# Patient Record
Sex: Male | Born: 1950
Health system: Southern US, Community
[De-identification: ages and names within clinical notes are randomized; demographics above are authoritative.]

## PROBLEM LIST (undated history)

## (undated) DIAGNOSIS — E119 Type 2 diabetes mellitus without complications: Secondary | ICD-10-CM

## (undated) DIAGNOSIS — E785 Hyperlipidemia, unspecified: Secondary | ICD-10-CM

## (undated) DIAGNOSIS — I10 Essential (primary) hypertension: Secondary | ICD-10-CM

## (undated) DIAGNOSIS — G629 Polyneuropathy, unspecified: Secondary | ICD-10-CM

## (undated) HISTORY — PX: HERNIA REPAIR: SHX51

## (undated) HISTORY — PX: CHOLECYSTECTOMY: SHX55

## (undated) HISTORY — PX: KNEE SURGERY: SHX244

---

## 2015-07-17 DIAGNOSIS — E1165 Type 2 diabetes mellitus with hyperglycemia: Secondary | ICD-10-CM | POA: Diagnosis not present

## 2015-07-17 DIAGNOSIS — R1011 Right upper quadrant pain: Secondary | ICD-10-CM | POA: Diagnosis not present

## 2015-07-17 DIAGNOSIS — L299 Pruritus, unspecified: Secondary | ICD-10-CM | POA: Diagnosis not present

## 2015-07-17 DIAGNOSIS — N183 Chronic kidney disease, stage 3 (moderate): Secondary | ICD-10-CM | POA: Diagnosis not present

## 2015-07-24 DIAGNOSIS — Z1389 Encounter for screening for other disorder: Secondary | ICD-10-CM | POA: Diagnosis not present

## 2015-07-24 DIAGNOSIS — E1165 Type 2 diabetes mellitus with hyperglycemia: Secondary | ICD-10-CM | POA: Diagnosis not present

## 2015-07-24 DIAGNOSIS — Z9181 History of falling: Secondary | ICD-10-CM | POA: Diagnosis not present

## 2015-12-15 DIAGNOSIS — E1140 Type 2 diabetes mellitus with diabetic neuropathy, unspecified: Secondary | ICD-10-CM | POA: Diagnosis not present

## 2015-12-15 DIAGNOSIS — L97819 Non-pressure chronic ulcer of other part of right lower leg with unspecified severity: Secondary | ICD-10-CM | POA: Diagnosis not present

## 2015-12-15 DIAGNOSIS — E1165 Type 2 diabetes mellitus with hyperglycemia: Secondary | ICD-10-CM | POA: Diagnosis not present

## 2015-12-15 DIAGNOSIS — L03031 Cellulitis of right toe: Secondary | ICD-10-CM | POA: Diagnosis not present

## 2015-12-22 DIAGNOSIS — E11610 Type 2 diabetes mellitus with diabetic neuropathic arthropathy: Secondary | ICD-10-CM | POA: Diagnosis not present

## 2015-12-22 DIAGNOSIS — M542 Cervicalgia: Secondary | ICD-10-CM | POA: Diagnosis not present

## 2015-12-23 DIAGNOSIS — E860 Dehydration: Secondary | ICD-10-CM | POA: Diagnosis not present

## 2015-12-23 DIAGNOSIS — E1165 Type 2 diabetes mellitus with hyperglycemia: Secondary | ICD-10-CM | POA: Diagnosis not present

## 2015-12-23 DIAGNOSIS — Z794 Long term (current) use of insulin: Secondary | ICD-10-CM | POA: Diagnosis not present

## 2016-02-29 DIAGNOSIS — R1084 Generalized abdominal pain: Secondary | ICD-10-CM | POA: Diagnosis not present

## 2016-04-04 DIAGNOSIS — E1140 Type 2 diabetes mellitus with diabetic neuropathy, unspecified: Secondary | ICD-10-CM | POA: Diagnosis not present

## 2016-04-04 DIAGNOSIS — E1165 Type 2 diabetes mellitus with hyperglycemia: Secondary | ICD-10-CM | POA: Diagnosis not present

## 2016-04-04 DIAGNOSIS — E1129 Type 2 diabetes mellitus with other diabetic kidney complication: Secondary | ICD-10-CM | POA: Diagnosis not present

## 2016-04-04 DIAGNOSIS — N183 Chronic kidney disease, stage 3 (moderate): Secondary | ICD-10-CM | POA: Diagnosis not present

## 2016-04-11 DIAGNOSIS — Z86010 Personal history of colonic polyps: Secondary | ICD-10-CM | POA: Diagnosis not present

## 2016-04-11 DIAGNOSIS — K580 Irritable bowel syndrome with diarrhea: Secondary | ICD-10-CM | POA: Diagnosis not present

## 2016-04-11 DIAGNOSIS — R1011 Right upper quadrant pain: Secondary | ICD-10-CM | POA: Diagnosis not present

## 2016-04-11 DIAGNOSIS — K219 Gastro-esophageal reflux disease without esophagitis: Secondary | ICD-10-CM | POA: Diagnosis not present

## 2016-04-26 DIAGNOSIS — K2900 Acute gastritis without bleeding: Secondary | ICD-10-CM | POA: Diagnosis not present

## 2016-04-26 DIAGNOSIS — D122 Benign neoplasm of ascending colon: Secondary | ICD-10-CM | POA: Diagnosis not present

## 2016-04-26 DIAGNOSIS — R1012 Left upper quadrant pain: Secondary | ICD-10-CM | POA: Diagnosis not present

## 2016-04-26 DIAGNOSIS — R1011 Right upper quadrant pain: Secondary | ICD-10-CM | POA: Diagnosis not present

## 2016-04-26 DIAGNOSIS — Z1211 Encounter for screening for malignant neoplasm of colon: Secondary | ICD-10-CM | POA: Diagnosis not present

## 2016-04-26 DIAGNOSIS — Z86010 Personal history of colonic polyps: Secondary | ICD-10-CM | POA: Diagnosis not present

## 2016-04-26 DIAGNOSIS — K219 Gastro-esophageal reflux disease without esophagitis: Secondary | ICD-10-CM | POA: Diagnosis not present

## 2016-04-26 DIAGNOSIS — K2950 Unspecified chronic gastritis without bleeding: Secondary | ICD-10-CM | POA: Diagnosis not present

## 2016-04-26 DIAGNOSIS — K5730 Diverticulosis of large intestine without perforation or abscess without bleeding: Secondary | ICD-10-CM | POA: Diagnosis not present

## 2016-07-01 DIAGNOSIS — N183 Chronic kidney disease, stage 3 (moderate): Secondary | ICD-10-CM | POA: Diagnosis not present

## 2016-07-01 DIAGNOSIS — E1165 Type 2 diabetes mellitus with hyperglycemia: Secondary | ICD-10-CM | POA: Diagnosis not present

## 2016-07-01 DIAGNOSIS — E785 Hyperlipidemia, unspecified: Secondary | ICD-10-CM | POA: Diagnosis not present

## 2016-07-01 DIAGNOSIS — I10 Essential (primary) hypertension: Secondary | ICD-10-CM | POA: Diagnosis not present

## 2016-07-01 DIAGNOSIS — E1140 Type 2 diabetes mellitus with diabetic neuropathy, unspecified: Secondary | ICD-10-CM | POA: Diagnosis not present

## 2016-07-03 DIAGNOSIS — E1165 Type 2 diabetes mellitus with hyperglycemia: Secondary | ICD-10-CM | POA: Diagnosis not present

## 2016-07-17 DIAGNOSIS — E1165 Type 2 diabetes mellitus with hyperglycemia: Secondary | ICD-10-CM | POA: Diagnosis not present

## 2016-10-02 DIAGNOSIS — D539 Nutritional anemia, unspecified: Secondary | ICD-10-CM | POA: Diagnosis not present

## 2016-10-02 DIAGNOSIS — Z125 Encounter for screening for malignant neoplasm of prostate: Secondary | ICD-10-CM | POA: Diagnosis not present

## 2016-10-02 DIAGNOSIS — E1165 Type 2 diabetes mellitus with hyperglycemia: Secondary | ICD-10-CM | POA: Diagnosis not present

## 2016-10-02 DIAGNOSIS — Z79899 Other long term (current) drug therapy: Secondary | ICD-10-CM | POA: Diagnosis not present

## 2016-10-15 DIAGNOSIS — E1165 Type 2 diabetes mellitus with hyperglycemia: Secondary | ICD-10-CM | POA: Diagnosis not present

## 2017-04-22 DIAGNOSIS — E1165 Type 2 diabetes mellitus with hyperglycemia: Secondary | ICD-10-CM | POA: Diagnosis not present

## 2017-05-05 DIAGNOSIS — L820 Inflamed seborrheic keratosis: Secondary | ICD-10-CM | POA: Diagnosis not present

## 2017-05-05 DIAGNOSIS — D485 Neoplasm of uncertain behavior of skin: Secondary | ICD-10-CM | POA: Diagnosis not present

## 2017-05-05 DIAGNOSIS — D225 Melanocytic nevi of trunk: Secondary | ICD-10-CM | POA: Diagnosis not present

## 2017-05-19 DIAGNOSIS — J40 Bronchitis, not specified as acute or chronic: Secondary | ICD-10-CM | POA: Diagnosis not present

## 2017-06-13 DIAGNOSIS — J40 Bronchitis, not specified as acute or chronic: Secondary | ICD-10-CM | POA: Diagnosis not present

## 2017-07-14 DIAGNOSIS — J209 Acute bronchitis, unspecified: Secondary | ICD-10-CM | POA: Diagnosis not present

## 2017-07-21 DIAGNOSIS — E1140 Type 2 diabetes mellitus with diabetic neuropathy, unspecified: Secondary | ICD-10-CM | POA: Diagnosis not present

## 2017-07-21 DIAGNOSIS — L03031 Cellulitis of right toe: Secondary | ICD-10-CM | POA: Diagnosis not present

## 2017-07-28 DIAGNOSIS — Z1331 Encounter for screening for depression: Secondary | ICD-10-CM | POA: Diagnosis not present

## 2017-07-28 DIAGNOSIS — Z1339 Encounter for screening examination for other mental health and behavioral disorders: Secondary | ICD-10-CM | POA: Diagnosis not present

## 2017-07-28 DIAGNOSIS — L03031 Cellulitis of right toe: Secondary | ICD-10-CM | POA: Diagnosis not present

## 2017-07-28 DIAGNOSIS — E11621 Type 2 diabetes mellitus with foot ulcer: Secondary | ICD-10-CM | POA: Diagnosis not present

## 2017-07-28 DIAGNOSIS — L97519 Non-pressure chronic ulcer of other part of right foot with unspecified severity: Secondary | ICD-10-CM | POA: Diagnosis not present

## 2017-07-29 DIAGNOSIS — Z794 Long term (current) use of insulin: Secondary | ICD-10-CM | POA: Diagnosis not present

## 2017-07-29 DIAGNOSIS — L97512 Non-pressure chronic ulcer of other part of right foot with fat layer exposed: Secondary | ICD-10-CM | POA: Diagnosis not present

## 2017-07-29 DIAGNOSIS — E11621 Type 2 diabetes mellitus with foot ulcer: Secondary | ICD-10-CM | POA: Diagnosis not present

## 2017-07-29 DIAGNOSIS — I10 Essential (primary) hypertension: Secondary | ICD-10-CM | POA: Diagnosis not present

## 2017-07-29 DIAGNOSIS — E1140 Type 2 diabetes mellitus with diabetic neuropathy, unspecified: Secondary | ICD-10-CM | POA: Diagnosis not present

## 2017-08-05 DIAGNOSIS — L97519 Non-pressure chronic ulcer of other part of right foot with unspecified severity: Secondary | ICD-10-CM | POA: Diagnosis not present

## 2017-08-05 DIAGNOSIS — E11621 Type 2 diabetes mellitus with foot ulcer: Secondary | ICD-10-CM | POA: Diagnosis not present

## 2017-08-05 DIAGNOSIS — I10 Essential (primary) hypertension: Secondary | ICD-10-CM | POA: Diagnosis not present

## 2017-08-05 DIAGNOSIS — L97511 Non-pressure chronic ulcer of other part of right foot limited to breakdown of skin: Secondary | ICD-10-CM | POA: Diagnosis not present

## 2017-08-19 DIAGNOSIS — I10 Essential (primary) hypertension: Secondary | ICD-10-CM | POA: Diagnosis not present

## 2017-08-19 DIAGNOSIS — E1140 Type 2 diabetes mellitus with diabetic neuropathy, unspecified: Secondary | ICD-10-CM | POA: Diagnosis not present

## 2017-08-19 DIAGNOSIS — E1129 Type 2 diabetes mellitus with other diabetic kidney complication: Secondary | ICD-10-CM | POA: Diagnosis not present

## 2017-08-19 DIAGNOSIS — N183 Chronic kidney disease, stage 3 (moderate): Secondary | ICD-10-CM | POA: Diagnosis not present

## 2017-09-12 ENCOUNTER — Observation Stay (HOSPITAL_COMMUNITY)
Admission: EM | Admit: 2017-09-12 | Discharge: 2017-09-13 | Disposition: A | Discharge status: Home / Self Care | Payer: Medicare Other | Attending: Internal Medicine | Admitting: Internal Medicine

## 2017-09-12 ENCOUNTER — Encounter (HOSPITAL_COMMUNITY): Payer: Self-pay

## 2017-09-12 ENCOUNTER — Emergency Department (HOSPITAL_COMMUNITY): Payer: Medicare Other

## 2017-09-12 DIAGNOSIS — E785 Hyperlipidemia, unspecified: Secondary | ICD-10-CM | POA: Diagnosis present

## 2017-09-12 DIAGNOSIS — R079 Chest pain, unspecified: Secondary | ICD-10-CM | POA: Diagnosis not present

## 2017-09-12 DIAGNOSIS — G629 Polyneuropathy, unspecified: Secondary | ICD-10-CM

## 2017-09-12 DIAGNOSIS — N183 Chronic kidney disease, stage 3 unspecified: Secondary | ICD-10-CM | POA: Diagnosis present

## 2017-09-12 DIAGNOSIS — I10 Essential (primary) hypertension: Secondary | ICD-10-CM | POA: Diagnosis not present

## 2017-09-12 DIAGNOSIS — R0789 Other chest pain: Principal | ICD-10-CM | POA: Insufficient documentation

## 2017-09-12 DIAGNOSIS — Z7982 Long term (current) use of aspirin: Secondary | ICD-10-CM | POA: Insufficient documentation

## 2017-09-12 DIAGNOSIS — R05 Cough: Secondary | ICD-10-CM | POA: Diagnosis not present

## 2017-09-12 DIAGNOSIS — E119 Type 2 diabetes mellitus without complications: Secondary | ICD-10-CM | POA: Diagnosis not present

## 2017-09-12 DIAGNOSIS — E1122 Type 2 diabetes mellitus with diabetic chronic kidney disease: Secondary | ICD-10-CM | POA: Diagnosis not present

## 2017-09-12 DIAGNOSIS — K219 Gastro-esophageal reflux disease without esophagitis: Secondary | ICD-10-CM | POA: Insufficient documentation

## 2017-09-12 DIAGNOSIS — I129 Hypertensive chronic kidney disease with stage 1 through stage 4 chronic kidney disease, or unspecified chronic kidney disease: Secondary | ICD-10-CM | POA: Insufficient documentation

## 2017-09-12 DIAGNOSIS — Z794 Long term (current) use of insulin: Secondary | ICD-10-CM | POA: Insufficient documentation

## 2017-09-12 DIAGNOSIS — R0602 Shortness of breath: Secondary | ICD-10-CM

## 2017-09-12 DIAGNOSIS — E1142 Type 2 diabetes mellitus with diabetic polyneuropathy: Secondary | ICD-10-CM | POA: Diagnosis not present

## 2017-09-12 DIAGNOSIS — R072 Precordial pain: Secondary | ICD-10-CM | POA: Diagnosis not present

## 2017-09-12 DIAGNOSIS — R9431 Abnormal electrocardiogram [ECG] [EKG]: Secondary | ICD-10-CM | POA: Diagnosis not present

## 2017-09-12 DIAGNOSIS — I4510 Unspecified right bundle-branch block: Secondary | ICD-10-CM | POA: Diagnosis not present

## 2017-09-12 DIAGNOSIS — Z79899 Other long term (current) drug therapy: Secondary | ICD-10-CM | POA: Insufficient documentation

## 2017-09-12 DIAGNOSIS — I160 Hypertensive urgency: Secondary | ICD-10-CM | POA: Diagnosis not present

## 2017-09-12 HISTORY — DX: Type 2 diabetes mellitus without complications: E11.9

## 2017-09-12 HISTORY — DX: Essential (primary) hypertension: I10

## 2017-09-12 HISTORY — DX: Hyperlipidemia, unspecified: E78.5

## 2017-09-12 HISTORY — DX: Polyneuropathy, unspecified: G62.9

## 2017-09-12 LAB — BASIC METABOLIC PANEL
Anion gap: 9 (ref 5–15)
BUN: 14 mg/dL (ref 6–20)
CALCIUM: 10.1 mg/dL (ref 8.9–10.3)
CO2: 25 mmol/L (ref 22–32)
Chloride: 104 mmol/L (ref 101–111)
Creatinine, Ser: 1.45 mg/dL — ABNORMAL HIGH (ref 0.61–1.24)
GFR calc Af Amer: 56 mL/min — ABNORMAL LOW (ref >60)
GFR, EST NON AFRICAN AMERICAN: 48 mL/min — AB (ref >60)
GLUCOSE: 155 mg/dL — AB (ref 65–99)
Potassium: 4.2 mmol/L (ref 3.5–5.1)
Sodium: 138 mmol/L (ref 135–145)

## 2017-09-12 LAB — TROPONIN I: Troponin I: <0.03 ng/mL (ref <0.03)

## 2017-09-12 LAB — URINALYSIS, ROUTINE W REFLEX MICROSCOPIC
BILIRUBIN URINE: NEGATIVE
Bacteria, UA: NONE SEEN
GLUCOSE, UA: NEGATIVE mg/dL
KETONES UR: NEGATIVE mg/dL
LEUKOCYTES UA: NEGATIVE
NITRITE: NEGATIVE
PH: 5 (ref 5.0–8.0)
Protein, ur: 30 mg/dL — AB
SPECIFIC GRAVITY, URINE: 1.006 (ref 1.005–1.030)
SQUAMOUS EPITHELIAL / LPF: NONE SEEN

## 2017-09-12 LAB — I-STAT TROPONIN, ED: TROPONIN I, POC: 0.03 ng/mL (ref 0.00–0.08)

## 2017-09-12 LAB — CBG MONITORING, ED
GLUCOSE-CAPILLARY: 208 mg/dL — AB (ref 65–99)
Glucose-Capillary: 112 mg/dL — ABNORMAL HIGH (ref 65–99)
Glucose-Capillary: 134 mg/dL — ABNORMAL HIGH (ref 65–99)

## 2017-09-12 LAB — HEMOGLOBIN A1C
HEMOGLOBIN A1C: 7.8 % — AB (ref 4.8–5.6)
MEAN PLASMA GLUCOSE: 177.16 mg/dL

## 2017-09-12 LAB — CBC
HCT: 38.5 % — ABNORMAL LOW (ref 39.0–52.0)
HEMOGLOBIN: 12.8 g/dL — AB (ref 13.0–17.0)
MCH: 27.5 pg (ref 26.0–34.0)
MCHC: 33.2 g/dL (ref 30.0–36.0)
MCV: 82.6 fL (ref 78.0–100.0)
Platelets: 172 10*3/uL (ref 150–400)
RBC: 4.66 MIL/uL (ref 4.22–5.81)
RDW: 14.2 % (ref 11.5–15.5)
WBC: 7.4 10*3/uL (ref 4.0–10.5)

## 2017-09-12 LAB — D-DIMER, QUANTITATIVE: D-Dimer, Quant: 0.42 ug/mL-FEU (ref 0.00–0.50)

## 2017-09-12 LAB — CK: CK TOTAL: 222 U/L (ref 49–397)

## 2017-09-12 LAB — LIPID PANEL
Cholesterol: 113 mg/dL (ref 0–200)
HDL: 34 mg/dL — ABNORMAL LOW (ref >40)
LDL CALC: 62 mg/dL (ref 0–99)
TRIGLYCERIDES: 85 mg/dL (ref <150)
Total CHOL/HDL Ratio: 3.3 RATIO
VLDL: 17 mg/dL (ref 0–40)

## 2017-09-12 LAB — BRAIN NATRIURETIC PEPTIDE: B Natriuretic Peptide: 165.8 pg/mL — ABNORMAL HIGH (ref 0.0–100.0)

## 2017-09-12 MED ORDER — HYDRALAZINE HCL 20 MG/ML IJ SOLN: 10.0000 mg | INTRAMUSCULAR | Status: DC | PRN

## 2017-09-12 MED ORDER — INSULIN GLARGINE 300 UNIT/ML ~~LOC~~ SOPN: 80.0000 [IU] | PEN_INJECTOR | Freq: Every day | SUBCUTANEOUS | Status: DC

## 2017-09-12 MED ORDER — INSULIN ASPART 100 UNIT/ML ~~LOC~~ SOLN
0.0000 [IU] | Freq: Three times a day (TID) | SUBCUTANEOUS | Status: DC
  Administered 2017-09-12: 1 [IU] via SUBCUTANEOUS
  Administered 2017-09-13 (×2): 3 [IU] via SUBCUTANEOUS
  Administered 2017-09-13: 2 [IU] via SUBCUTANEOUS
  Filled 2017-09-12 (×3): qty 1

## 2017-09-12 MED ORDER — METOPROLOL SUCCINATE ER 100 MG PO TB24: 100.0000 mg | ORAL_TABLET | Freq: Every day | ORAL | Status: DC

## 2017-09-12 MED ORDER — GI COCKTAIL ~~LOC~~
30.0000 mL | Freq: Once | ORAL | Status: AC
  Administered 2017-09-12: 30 mL via ORAL
  Filled 2017-09-12: qty 30

## 2017-09-12 MED ORDER — AMLODIPINE BESYLATE 5 MG PO TABS
5.0000 mg | ORAL_TABLET | Freq: Once | ORAL | Status: AC
  Administered 2017-09-12: 5 mg via ORAL
  Filled 2017-09-12: qty 1

## 2017-09-12 MED ORDER — NITROGLYCERIN 0.4 MG SL SUBL: 0.4000 mg | SUBLINGUAL_TABLET | SUBLINGUAL | Status: DC | PRN

## 2017-09-12 MED ORDER — LIDOCAINE 5 % EX PTCH
1.0000 | MEDICATED_PATCH | CUTANEOUS | Status: DC
  Administered 2017-09-12: 1 via TRANSDERMAL
  Filled 2017-09-12: qty 1

## 2017-09-12 MED ORDER — PRAVASTATIN SODIUM 20 MG PO TABS
10.0000 mg | ORAL_TABLET | Freq: Every day | ORAL | Status: DC
  Filled 2017-09-12: qty 1

## 2017-09-12 MED ORDER — ONDANSETRON HCL 4 MG/2ML IJ SOLN: 4.0000 mg | Freq: Four times a day (QID) | INTRAMUSCULAR | Status: DC | PRN

## 2017-09-12 MED ORDER — ACETAMINOPHEN 325 MG PO TABS
650.0000 mg | ORAL_TABLET | ORAL | Status: DC | PRN
  Administered 2017-09-12: 650 mg via ORAL
  Filled 2017-09-12: qty 2

## 2017-09-12 MED ORDER — METOPROLOL SUCCINATE 100 MG PO CS24: 100.0000 mg | EXTENDED_RELEASE_CAPSULE | Freq: Every day | ORAL | Status: DC

## 2017-09-12 MED ORDER — PREGABALIN 25 MG PO CAPS: 75.0000 mg | ORAL_CAPSULE | Freq: Every day | ORAL | Status: DC

## 2017-09-12 MED ORDER — LISINOPRIL 20 MG PO TABS
20.0000 mg | ORAL_TABLET | Freq: Two times a day (BID) | ORAL | Status: DC
  Administered 2017-09-12 – 2017-09-13 (×2): 20 mg via ORAL
  Filled 2017-09-12 (×2): qty 1

## 2017-09-12 MED ORDER — INSULIN ASPART 100 UNIT/ML ~~LOC~~ SOLN
0.0000 [IU] | Freq: Every day | SUBCUTANEOUS | Status: DC
  Administered 2017-09-12: 2 [IU] via SUBCUTANEOUS
  Filled 2017-09-12: qty 1

## 2017-09-12 MED ORDER — ENOXAPARIN SODIUM 40 MG/0.4ML ~~LOC~~ SOLN
40.0000 mg | SUBCUTANEOUS | Status: DC
  Administered 2017-09-12: 40 mg via SUBCUTANEOUS
  Filled 2017-09-12 (×2): qty 0.4

## 2017-09-12 MED ORDER — HYDRALAZINE HCL 20 MG/ML IJ SOLN
10.0000 mg | Freq: Once | INTRAMUSCULAR | Status: AC
  Administered 2017-09-12: 10 mg via INTRAVENOUS
  Filled 2017-09-12: qty 1

## 2017-09-12 MED ORDER — PREGABALIN 75 MG PO CAPS
75.0000 mg | ORAL_CAPSULE | Freq: Every day | ORAL | Status: DC
  Administered 2017-09-12: 75 mg via ORAL
  Filled 2017-09-12: qty 1

## 2017-09-12 MED ORDER — FLUTICASONE PROPIONATE 50 MCG/ACT NA SUSP: 1.0000 | Freq: Every day | NASAL | Status: DC | PRN

## 2017-09-12 MED ORDER — FUROSEMIDE 10 MG/ML IJ SOLN
20.0000 mg | Freq: Once | INTRAMUSCULAR | Status: AC
  Administered 2017-09-12: 20 mg via INTRAVENOUS
  Filled 2017-09-12: qty 2

## 2017-09-12 NOTE — Consult Note (Signed)
Cardiology Consultation:   Patient ID: Grant Patterson; [id number]; [birth year]   Admit date: 09/12/2017 Date of Consult: 09/12/2017  Primary Care Provider: Mateo Flow, MD Primary Cardiologist: new to [facility]   Patient Profile:   Grant Patterson is a 67 y.o. male with a hx of HTN, DM, and HLD who is being seen today for the evaluation of chest pain and SOB at the request of Dr. Reesa Chew.  History of Present Illness:   Grant Patterson is a 67 y.o. male with a hx of HTN, DM, and HLD who presents with CP, SOB, and cough for the past several weeks. Patient states he was in his usual state of health until 2 months ago when he developed multiple URIs (back to back) associated with SOB and a non-productive cough for which he was treated with antibiotics 2 weeks ago. His symptoms did not improve. At his last follow-up visit with his PMD office, he was found to have an elevated BP and his metoprolol was increased from 50mg  to 100mg  daily. Over the past several days, he has noted elevated BP at home (as high as 200s/100s) prompting him to present to an urgent care in [place], where he was found to have an abnormal EKG. Patient also reports left upper chest pain, non-radiating, described as a dull pain for the past 3 days. Pain is not associated with activity, SOB, diaphoresis, N/V. Patient states he thought it could be indigestion and has been taking tums without relief. Has had some LE edema, worsening over the past month. He denies orthopnea, PND, DOE, chest pain on exertion. Denies fevers.  Past Medical History:  Diagnosis Date  . Diabetes mellitus without complication ([place])   . Hyperlipidemia   . Hypertension   . Neuropathy     Past Surgical History:  Procedure Laterality Date  . CHOLECYSTECTOMY    . HERNIA REPAIR    . KNEE SURGERY         Inpatient Medications: Scheduled Meds: . enoxaparin (LOVENOX) injection  40 mg Subcutaneous Q24H  . gi cocktail  30 mL Oral Once  . insulin aspart  0-5  Units Subcutaneous QHS  . insulin aspart  0-9 Units Subcutaneous TID WC  . lidocaine  1 patch Transdermal Q24H  . lisinopril  20 mg Oral BID  . [START ON 09/13/2017] metoprolol succinate  100 mg Oral Daily  . [START ON 09/13/2017] pravastatin  10 mg Oral q1800  . [START ON 09/13/2017] pregabalin  75 mg Oral QHS   Continuous Infusions:  PRN Meds: acetaminophen, fluticasone, hydrALAZINE, nitroGLYCERIN, ondansetron (ZOFRAN) IV  Allergies:   No Known Allergies  Social History:   Social History   Socioeconomic History  . Marital status: Married    Spouse name: Not on file  . Number of children: Not on file  . Years of education: Not on file  . Highest education level: Not on file  Social Needs  . Financial resource strain: Not on file  . Food insecurity - worry: Not on file  . Food insecurity - inability: Not on file  . Transportation needs - medical: Not on file  . Transportation needs - non-medical: Not on file  Occupational History  . Not on file  Tobacco Use  . Smoking status: Never Smoker  . Smokeless tobacco: Never Used  Substance and Sexual Activity  . Alcohol use: No    Frequency: Never  . Drug use: No  . Sexual activity: Not Currently  Other Topics Concern  .  Not on file  Social History Narrative  . Not on file    Family History:   Father with lung cancer, Mother with COPD. No family history of MI.  ROS:  Please see the history of present illness.   All other ROS reviewed and negative.     Physical Exam/Data:   Vitals:   09/12/17 1430 09/12/17 1445 09/12/17 1515 09/12/17 1600  BP: (!) 176/86 (!) 183/86 (!) 197/89 (!) 184/91  Pulse: 68 71 70 71  Resp: 20 16 16  (!) 21  Temp:      TempSrc:      SpO2: 96% 96% 96% 95%  Weight:      Height:       No intake or output data in the 24 hours ending 09/12/17 1736 Filed Weights   09/12/17 1318  Weight: 215 lb (97.5 kg)   Body mass index is 32.69 kg/m.  General:  Well nourished, well developed, laying in bed  in no acute distress HEENT: sclera anicteric  Lymph: no adenopathy Neck: no JVD Vascular: No carotid bruits; distal pulses 2+ bilaterally Cardiac:  normal S1, S2; RRR; no murmur, gallops, or rubs Lungs:  clear to auscultation bilaterally, no wheezing, rhonchi or rales  Abd: NABS, soft, nontender, no hepatomegaly Ext: 1+ b/l LE edema Musculoskeletal:  No deformities, BUE and BLE strength normal and equal Skin: warm and dry  Neuro:  CNs 2-12 intact, no focal abnormalities noted Psych:  Normal affect   EKG:  The EKG was personally reviewed and demonstrates:  EKG with NSR, RBBB, isolated STE V2 Telemetry:  Telemetry was personally reviewed and demonstrates:  SR with occasional sinus tach  Relevant CV Studies: Echo ordered  Laboratory Data:  Chemistry Recent Labs  Lab 09/12/17 1354  NA 138  K 4.2  CL 104  CO2 25  GLUCOSE 155*  BUN 14  CREATININE 1.45*  CALCIUM 10.1  GFRNONAA 48*  GFRAA 56*  ANIONGAP 9    No results for input(s): PROT, ALBUMIN, AST, ALT, ALKPHOS, BILITOT in the last 168 hours. Hematology Recent Labs  Lab 09/12/17 1354  WBC 7.4  RBC 4.66  HGB 12.8*  HCT 38.5*  MCV 82.6  MCH 27.5  MCHC 33.2  RDW 14.2  PLT 172   Cardiac EnzymesNo results for input(s): TROPONINI in the last 168 hours.  Recent Labs  Lab 09/12/17 1337  TROPIPOC 0.03    BNP Recent Labs  Lab 09/12/17 1344  BNP 165.8*    DDimer  Recent Labs  Lab 09/12/17 1507  DDIMER 0.42    Radiology/Studies:  Dg Chest 2 View  Result Date: 09/12/2017 CLINICAL DATA:  Chest pain short of breath EXAM: CHEST  2 VIEW COMPARISON:  12/23/2015 FINDINGS: The heart size and mediastinal contours are within normal limits. Both lungs are clear. The visualized skeletal structures are unremarkable. IMPRESSION: No active cardiopulmonary disease. Electronically Signed   By: Franchot Gallo M.D.   On: 09/12/2017 14:05    Assessment and Plan:   1. Chest pain: chest pain is atypical - Troponin negative x1  - trend x3 - EKG with NSR, RBBB, isolated STE V2 - BNP 165.8 - Ddimer wnl - CXR without acute findings - s/p 1x dose IV lasix 20mg   - Echo ordered - Would favor exercise stress test given risk factors for CAD - will make NPO after MN. Holding home metoprolol in AM for stress test  2. HTN: BP elevated since presentation to the ED - Home HCTZ on hold - pt  received IV lasix x1 dose - Continue home metoprolol, lisinopril - Can consider adding amlodipine if BP remains poorly controlled  3. Elevated Cr: states Cr 1.4 on outpatient labs 2-3 weeks ago - Cr 1.45 - Continue to monitor closely  4. DM: - HgbA1C ordered - Continue ISS per primary team  5. HLD: - Lipid profile ordered - Continue statin   For questions or updates, please contact [facility] Please consult [email or web address] for contact info under Cardiology/STEMI.   Signed, Abigail Butts, PA-C  09/12/2017 5:36 PM [phone]  Patient examined chart reviewed. Exam with white male in no distress Clear lungs no murmur. No abdominal bruit good peripheral pulses and no edema. Appears to have poorly controlled HTN in setting of DM and elevated Cr Has had URI with cough for few weeks. CXR with no pneumonia Atypical chest pain in this setting with no acute ECG changes only RBBB and negative troponin Given DM would order exercise myovue for am. Continue ACE for BP in setting DM and can add norvasc 5-10 mg if needed for better control Discussed care with patient, PA, wife and daughter.   Jenkins Rouge

## 2017-09-12 NOTE — ED Triage Notes (Signed)
Patient presents from urgent care in [place] via [employer]. He states that he has been having left upper chest pain that is worse with movement, palpation, and deep breathing. Pt received 324mg  asa and 1 sl nitro pta at the urgent care. With ekg ems reported some st elevation only in v2. Pt is also showing right bbb on the monitor. Pt states that the pain has been there for the past two weeks. Alert and oriented. Protocols initiated upon arrival to dept.

## 2017-09-12 NOTE — ED Notes (Signed)
Heart healthy carb modified dinner tray ordered @1746 

## 2017-09-12 NOTE — ED Provider Notes (Signed)
[facility] EMERGENCY DEPARTMENT Provider Note   CSN: [id number] Arrival date & time: 09/12/17  1313     History   Chief Complaint Chief Complaint  Patient presents with  . Chest Pain    HPI Grant Patterson is a 67 y.o. male.  Patient with history of diabetes, hypertension on 3 medications, high cholesterol, remote catheterization --presents with complaint of shortness of breath, cough, and chest pain over the past several weeks.  Patient states that he went to see urgent care in [place] today.  He was found to have an abnormal EKG with ST segment elevation isolated to V2.  He was sent to the emergency department for further evaluation.  Patient has had shortness of breath and cough for the past 2 weeks and has been treated with ABX without improvement.  Cough is nonproductive.  He has worsening of lower extremity swelling.  Shortness of breath has "slowed me down a little bit".  No orthopnea.  He has chest pain in the upper left chest that does not radiate.  This is described as a soreness that only occurs when he coughs.  His blood pressure has been elevated recently and his primary care physician has been adjusting some of his medications.  No fevers, URI symptoms or other flulike symptoms.  Patient denies risk factors for pulmonary embolism including: unilateral leg swelling, history of DVT/PE/other blood clots, use of exogenous hormones, recent immobilizations, recent surgery, recent travel (>4hr segment), malignancy, hemoptysis.  The onset of this condition was acute.        Past Medical History:  Diagnosis Date  . Diabetes mellitus without complication ([place])   . Hyperlipidemia   . Hypertension   . Neuropathy     There are no active problems to display for this patient.   Past Surgical History:  Procedure Laterality Date  . CHOLECYSTECTOMY    . HERNIA REPAIR    . KNEE SURGERY         Home Medications    Prior to Admission medications     Medication Sig Start Date End Date Taking? Authorizing Provider  acetaminophen (TYLENOL) 325 MG tablet Take 650 mg by mouth every 6 (six) hours as needed for mild pain.    Yes [provider]  fluticasone (FLONASE) 50 MCG/ACT nasal spray Place 1 spray into both nostrils daily as needed for allergies or rhinitis.   Yes [provider]  hydrochlorothiazide (HYDRODIURIL) 25 MG tablet Take 12.5 mg by mouth daily.    Yes [provider]  insulin lispro (HUMALOG) 100 UNIT/ML injection Inject 6 Units into the skin 3 (three) times daily before meals.    Yes [provider]  JANUVIA 50 MG tablet Take 50 mg by mouth daily. 08/20/17  Yes [provider]  lisinopril (PRINIVIL,ZESTRIL) 20 MG tablet Take 20 mg by mouth 2 (two) times daily.    Yes [provider]  lovastatin (MEVACOR) 10 MG tablet Take 10 mg by mouth.    Yes [provider]  metFORMIN (GLUCOPHAGE-XR) 750 MG 24 hr tablet Take 750 mg by mouth 2 (two) times daily.   Yes [provider]  Metoprolol Succinate 100 MG CS24 Take 100 mg by mouth daily.    Yes [provider]  pregabalin (LYRICA) 75 MG capsule Take 75 mg by mouth.    Yes [provider]  TOUJEO SOLOSTAR 300 UNIT/ML SOPN Inject 80 Units into the skin daily. 09/06/17  Yes [provider]  Family History History reviewed. No pertinent family history.  Social History Social History   Tobacco Use  . Smoking status: Never Smoker  . Smokeless tobacco: Never Used  Substance Use Topics  . Alcohol use: No    Frequency: Never  . Drug use: No     Allergies   Patient has no known allergies.   Review of Systems Review of Systems  Constitutional: Negative for diaphoresis and fever.  HENT: Negative for congestion.   Eyes: Negative for redness.  Respiratory: Positive for cough and shortness of breath. Negative for wheezing.   Cardiovascular: Positive for chest pain and leg swelling.  Negative for palpitations.  Gastrointestinal: Negative for abdominal pain, nausea and vomiting.  Genitourinary: Negative for dysuria.  Musculoskeletal: Negative for back pain and neck pain.  Skin: Negative for rash.  Neurological: Negative for syncope and light-headedness.  Psychiatric/Behavioral: The patient is not nervous/anxious.      Physical Exam Updated Vital Signs BP (!) 190/94 (BP Location: Left Arm)   Pulse 73   Temp 97.8 F (36.6 C) (Oral)   Resp 20   Ht 5\' 8"  (1.727 m)   Wt 97.5 kg (215 lb)   SpO2 93%   BMI 32.69 kg/m   Physical Exam  Constitutional: He appears well-developed and well-nourished.  HENT:  Head: Normocephalic and atraumatic.  Mouth/Throat: Mucous membranes are normal. Mucous membranes are not dry.  Eyes: Conjunctivae are normal.  Neck: Trachea normal and normal range of motion. Neck supple. Normal carotid pulses and no JVD present. No muscular tenderness present. Carotid bruit is not present. No tracheal deviation present.  Cardiovascular: Normal rate, regular rhythm, S1 normal, S2 normal, normal heart sounds and intact distal pulses. Exam reveals no distant heart sounds and no decreased pulses.  No murmur heard. Pulmonary/Chest: Effort normal. No respiratory distress. He has no decreased breath sounds. He has wheezes (mild, scattered). He has no rhonchi. He has no rales. He exhibits no tenderness.  CP on palpation of the upper left chest wall.   Abdominal: Soft. Normal aorta and bowel sounds are normal. There is no tenderness. There is no rebound and no guarding.  Musculoskeletal: He exhibits no edema.  Trace symmetric LE edema to ankles. Socks leave slight markings.   Neurological: He is alert.  Skin: Skin is warm and dry. He is not diaphoretic. No cyanosis. No pallor.  Psychiatric: He has a normal mood and affect.  Nursing note and vitals reviewed.    ED Treatments / Results  Labs (all labs ordered are listed, but only abnormal results are  displayed) Labs Reviewed  BASIC METABOLIC PANEL - Abnormal; Notable for the following components:      Result Value   Glucose, Bld 155 (*)    Creatinine, Ser 1.45 (*)    GFR calc non Af Amer 48 (*)    GFR calc Af Amer 56 (*)    All other components within normal limits  CBC - Abnormal; Notable for the following components:   Hemoglobin 12.8 (*)    HCT 38.5 (*)    All other components within normal limits  BRAIN NATRIURETIC PEPTIDE - Abnormal; Notable for the following components:   B Natriuretic Peptide 165.8 (*)    All other components within normal limits  D-DIMER, QUANTITATIVE (NOT AT [facility])  I-STAT TROPONIN, ED    EKG  EKG Interpretation  Date/Time:  Friday September 12 2017 13:19:08 EST Ventricular Rate:  74 PR Interval:    QRS Duration: 150 QT Interval:  424  QTC Calculation: 471 R Axis:   -60 Text Interpretation:  Sinus rhythm RBBB and LAFB Left ventricular hypertrophy ST elevation in V2, possible related to LAFB and LVH Confirmed by Duffy Bruce [id number]) on 09/12/2017 1:26:30 PM       Radiology Dg Chest 2 View  Result Date: 09/12/2017 CLINICAL DATA:  Chest pain short of breath EXAM: CHEST  2 VIEW COMPARISON:  12/23/2015 FINDINGS: The heart size and mediastinal contours are within normal limits. Both lungs are clear. The visualized skeletal structures are unremarkable. IMPRESSION: No active cardiopulmonary disease. Electronically Signed   By: Franchot Gallo M.D.   On: 09/12/2017 14:05    Procedures Procedures (including critical care time)  Medications Ordered in ED Medications  furosemide (LASIX) injection 20 mg (not administered)     Initial Impression / Assessment and Plan / ED Course  I have reviewed the triage vital signs and the nursing notes.  Pertinent labs & imaging results that were available during my care of the patient were reviewed by me and considered in my medical decision making (see chart for details).     Patient seen and examined. Work-up  initiated. EKG reviewed. V2 isolated abnormal lead.   Vital signs reviewed and are as follows: BP (!) 176/86   Pulse 68   Temp 97.8 F (36.6 C) (Oral)   Resp 20   Ht 5\' 8"  (1.727 m)   Wt 97.5 kg (215 lb)   SpO2 96%   BMI 32.69 kg/m   Pt d/w Dr. Ellender Hose who will see. D-dimer added.   4:00 PM Will admit for CP/SOB, possible component of new-onset CHF. Will need echo.   HEART score = 5. 20mg  lasix ordered.   D-dimer is normal.   Will request unassigned admit.   4:36 PM Hospitalist to see. Repeat EKG unchanged.   Final Clinical Impressions(s) / ED Diagnoses   Final diagnoses:  Precordial pain  Shortness of breath  Abnormal EKG   Admit.   ED Discharge Orders    None       Carlisle Cater, Hershal Coria 09/12/17 1636    Duffy Bruce, MD 09/13/17 [phone]

## 2017-09-12 NOTE — H&P (Signed)
History and Physical    Grant Patterson KWI:[id number] DOB: Nov 29, 1950 DOA: 09/12/2017   PCP: Mateo Flow, MD   Attending physician: Reesa Chew  Patient coming from/Resides with: Private residence/family  Chief Complaint: Chest pain  HPI: Grant Patterson is a 67 y.o. male with medical history significant for diabetes mellitus on combination insulin and oral agents (diabetes greater than 25 years), difficult to control hypertension, peripheral neuropathy, chronic kidney disease stage III and dyslipidemia.  Patient reports that about 1-2 weeks ago he began having soreness in his left chest that was primarily reproducible with palpation directly over chest wall and upper extremity movement only.  He was not having typical exertional chest pain.  Over the past 3-4 days he has been experiencing reflux and has taken Tums with minimal relief.  In the past 48 hours he has taken 1 BC powder and a total of 6-8 Aleve.  He has not had any dark or bloody stools.  For about 2 to months he has had unexplained coughing and has seen multiple providers without any improvement in symptoms.  Over the past 2 weeks he has had increasing pedal edema especially at night.  He has never smoked.  Today because of persistent left-sided chest discomfort he presented initially to an urgent care in [place].  EMS performed EKG and noted the patient have ST segment elevation isolated to lead V2 therefore he was sent to the [facility] ER for further evaluation and treatment.  Upon evaluation here patient was found to have underlying right bundle branch block and previously noted ST segment elevation in V2 more consistent with J-point elevation in early repolarization.  Initial troponin unremarkable.  D-dimer negative at 0.42, BNP slightly elevated at 166 in the context of chronic kidney disease with a GFR 48.  Chest x-ray negative for edema or infiltrate and patient is not hypoxemic.  Patient will be placed in observation status to evaluate  ongoing chest pain.  ED Course:  Vital Signs: BP (!) 184/91   Pulse 71   Temp 97.8 F (36.6 C) (Oral)   Resp (!) 21   Ht '5\' 8"'$  (1.727 m)   Wt 97.5 kg (215 lb)   SpO2 95%   BMI 32.69 kg/m  Chest x-ray: As above Lab data: Sodium 138, potassium 4.2, chloride 104, CO2 25, glucose 155, BUN 14, creatinine 1.45, calcium 10.1, anion gap 9, BNP 166, poc troponin 0 0.03, white count 7400 differential not obtained, hemoglobin 12.8, platelets 172,000, d-dimer 0.42 Medications and treatments: Lasix 20 mg IV x1  Review of Systems:  In addition to the HPI above,  No Fever-chills, myalgias or other constitutional symptoms No Headache, changes with Vision or hearing, new weakness, tingling, numbness in any extremity, dizziness, dysarthria or word finding difficulty, gait disturbance or imbalance, tremors or seizure activity No problems swallowing food or Liquids, indigestion/reflux, choking or coughing while eating, abdominal pain with or after eating No palpitations, orthopnea or DOE No Abdominal pain, N/V, melena,hematochezia, dark tarry stools, constipation No dysuria, malodorous urine, hematuria or flank pain No new skin rashes, lesions, masses or bruises, No new joint pains, aches, swelling or redness No recent unintentional weight gain or loss No polyuria, polydypsia or polyphagia   Past Medical History:  Diagnosis Date  . Diabetes mellitus without complication ([place])   . Hyperlipidemia   . Hypertension   . Neuropathy     Past Surgical History:  Procedure Laterality Date  . CHOLECYSTECTOMY    . HERNIA REPAIR    .  KNEE SURGERY      Social History   Socioeconomic History  . Marital status: Married    Spouse name: Not on file  . Number of children: Not on file  . Years of education: Not on file  . Highest education level: Not on file  Social Needs  . Financial resource strain: Not on file  . Food insecurity - worry: Not on file  . Food insecurity - inability: Not on file  .  Transportation needs - medical: Not on file  . Transportation needs - non-medical: Not on file  Occupational History  . Not on file  Tobacco Use  . Smoking status: Never Smoker  . Smokeless tobacco: Never Used  Substance and Sexual Activity  . Alcohol use: No    Frequency: Never  . Drug use: No  . Sexual activity: Not Currently  Other Topics Concern  . Not on file  Social History Narrative  . Not on file    Mobility: Independent Work history: Not obtained   No Known Allergies  Family history reviewed and not pertinent -patient reports father died of lung cancer and mother died of complications related to COPD and had "stress related" heart attack  Prior to Admission medications   Medication Sig Start Date End Date Taking? Authorizing Provider  acetaminophen (TYLENOL) 325 MG tablet Take 650 mg by mouth every 6 (six) hours as needed for mild pain.    Yes [provider]  fluticasone (FLONASE) 50 MCG/ACT nasal spray Place 1 spray into both nostrils daily as needed for allergies or rhinitis.   Yes [provider]  hydrochlorothiazide (HYDRODIURIL) 25 MG tablet Take 12.5 mg by mouth daily.    Yes [provider]  insulin lispro (HUMALOG) 100 UNIT/ML injection Inject 6 Units into the skin 3 (three) times daily before meals.    Yes [provider]  JANUVIA 50 MG tablet Take 50 mg by mouth daily. 08/20/17  Yes [provider]  lisinopril (PRINIVIL,ZESTRIL) 20 MG tablet Take 20 mg by mouth 2 (two) times daily.    Yes [provider]  lovastatin (MEVACOR) 10 MG tablet Take 10 mg by mouth.    Yes [provider]  metFORMIN (GLUCOPHAGE-XR) 750 MG 24 hr tablet Take 750 mg by mouth 2 (two) times daily.   Yes [provider]  Metoprolol Succinate 100 MG CS24 Take 100 mg by mouth daily.    Yes [provider]  pregabalin (LYRICA) 75 MG capsule Take 75 mg by mouth.    Yes [provider]  TOUJEO SOLOSTAR  300 UNIT/ML SOPN Inject 80 Units into the skin daily. 09/06/17  Yes [provider]    Physical Exam: Vitals:   09/12/17 1430 09/12/17 1445 09/12/17 1515 09/12/17 1600  BP: (!) 176/86 (!) 183/86 (!) 197/89 (!) 184/91  Pulse: 68 71 70 71  Resp: '20 16 16 '$ (!) 21  Temp:      TempSrc:      SpO2: 96% 96% 96% 95%  Weight:      Height:          Constitutional: NAD, calm, comfortable Eyes: PERRL, lids and conjunctivae normal ENMT: Mucous membranes are moist. Posterior pharynx clear of any exudate or lesions.Normal dentition.  Neck: normal, supple, no masses, no thyromegaly Respiratory: clear to auscultation bilaterally, no wheezing, no crackles. Normal respiratory effort. No accessory muscle use.  Left anterior chest wall as well as left lateral chest and axilla tender to palpation.  Chest wall tenderness  also reproduced with movement of left upper extremity Cardiovascular: Regular rate and rhythm, no murmurs / rubs / gallops.  Trace bilateral pedal edema. 2+ pedal pulses. No carotid bruits.  Abdomen: no tenderness, no masses palpated. No hepatosplenomegaly. Bowel sounds positive.  Musculoskeletal: no clubbing / cyanosis. No joint deformity upper and lower extremities. Good ROM, no contractures. Normal muscle tone.  Skin: no rashes, lesions, ulcers. No induration Neurologic: CN 2-12 grossly intact. Sensation intact, DTR normal. Strength 5/5 x all 4 extremities.  Psychiatric: Normal judgment and insight. Alert and oriented x 3. Normal mood.    Labs on Admission: I have personally reviewed following labs and imaging studies  CBC: Recent Labs  Lab 09/12/17 1354  WBC 7.4  HGB 12.8*  HCT 38.5*  MCV 82.6  PLT 361   Basic Metabolic Panel: Recent Labs  Lab 09/12/17 1354  NA 138  K 4.2  CL 104  CO2 25  GLUCOSE 155*  BUN 14  CREATININE 1.45*  CALCIUM 10.1   GFR: Estimated Creatinine Clearance: 55.2 mL/min (A) (by C-G formula based on SCr of 1.45 mg/dL (H)). Liver  Function Tests: No results for input(s): AST, ALT, ALKPHOS, BILITOT, PROT, ALBUMIN in the last 168 hours. No results for input(s): LIPASE, AMYLASE in the last 168 hours. No results for input(s): AMMONIA in the last 168 hours. Coagulation Profile: No results for input(s): INR, PROTIME in the last 168 hours. Cardiac Enzymes: No results for input(s): CKTOTAL, CKMB, CKMBINDEX, TROPONINI in the last 168 hours. BNP (last 3 results) No results for input(s): PROBNP in the last 8760 hours. HbA1C: No results for input(s): HGBA1C in the last 72 hours. CBG: No results for input(s): GLUCAP in the last 168 hours. Lipid Profile: No results for input(s): CHOL, HDL, LDLCALC, TRIG, CHOLHDL, LDLDIRECT in the last 72 hours. Thyroid Function Tests: No results for input(s): TSH, T4TOTAL, FREET4, T3FREE, THYROIDAB in the last 72 hours. Anemia Panel: No results for input(s): VITAMINB12, FOLATE, FERRITIN, TIBC, IRON, RETICCTPCT in the last 72 hours. Urine analysis: No results found for: COLORURINE, APPEARANCEUR, LABSPEC, PHURINE, GLUCOSEU, HGBUR, BILIRUBINUR, KETONESUR, PROTEINUR, UROBILINOGEN, NITRITE, LEUKOCYTESUR Sepsis Labs: '@LABRCNTIP'$ (procalcitonin:4,lacticidven:4) )No results found for this or any previous visit (from the past 240 hour(s)).   Radiological Exams on Admission: Dg Chest 2 View  Result Date: 09/12/2017 CLINICAL DATA:  Chest pain short of breath EXAM: CHEST  2 VIEW COMPARISON:  12/23/2015 FINDINGS: The heart size and mediastinal contours are within normal limits. Both lungs are clear. The visualized skeletal structures are unremarkable. IMPRESSION: No active cardiopulmonary disease. Electronically Signed   By: Franchot Gallo M.D.   On: 09/12/2017 14:05    EKG: (Independently reviewed) sinus rhythm with ventricular rate 70 bpm, QTC 458 ms, voltage criteria met for LVH as well as RVH, underlying right bundle branch block, abnormal ST segment in V2 consistent with early repolarization, no  definitive ST segment changes concerning for ischemia  Assessment/Plan Principal Problem:   Chest pain -Patient presents with atypical chest pain that is reproducible with palpation; more concerning is 2 months of unexplained cough with negative chest x-ray and mildly elevated BNP and trace persistent pedal edema -Heart score = 6 -Echocardiogram -Cycle troponin -CK -Given lack of hypoxemia or abnormal chest x-ray, not convinced this is a true heart failure exacerbation therefore will not give additional Lasix.  Suspect BNP elevation related to low GFR in context of chronic kidney disease -Cardiology consulted in the event patient needs to undergo more detailed diagnostic testing such as Myoview stress  test or cardiac catheterization prior to discharge -Patient reports had remote cardiac catheterization about 15-20 years ago at [facility] preadmission beta-blocker and statin -NTG SL prn chest pain -Lidoderm patch as well as K pad for musculoskeletal component -No NSAIDs for musculoskeletal component secondary to underlying chronic kidney disease-patient did take 1 BC Powder and several Aleve over the past few weeks for symptoms -GI cocktail x1  Active Problems:   RBBB -Unknown if old or new -Follow-up on echo    Diabetes mellitus without complication  -Hold metformin acutely -Hold Toujeo since will be NPO after midnight -Follow CBGs/provide SSI -HgbA1c    Hypertension -Not currently well controlled -On lisinopril, metoprolol and HCTZ prior to admission -Hold HCTZ for now -Renal function at baseline per patient report (see below) so we will continue ACE inhibitor -Beta blocker as above -Hydralazine IV prn    CKD (chronic kidney disease), stage III -Reports baseline creatinine 1.46 -Current creatinine 1.45 -Obtain urinalysis-suspect patient has evolving diabetic nephropathy with a hypertensive component    Neuropathy -Continue preadmission Lyrica at at  bedtime    Hyperlipidemia -Continue Pravachol      DVT prophylaxis: Lovenox Code Status: Full Family Communication: Wife and daughter Disposition Plan: Home Consults called: Cardiology/[facility] on call    Grant Patterson,Grant L. ANP-BC [facility] Pager [phone]   If 7PM-7AM, please contact night-coverage [email or web address] Password [email or web address]  09/12/2017, 5:03 PM

## 2017-09-13 ENCOUNTER — Other Ambulatory Visit: Payer: Self-pay

## 2017-09-13 ENCOUNTER — Observation Stay (HOSPITAL_BASED_OUTPATIENT_CLINIC_OR_DEPARTMENT_OTHER): Payer: Medicare Other

## 2017-09-13 ENCOUNTER — Observation Stay (HOSPITAL_COMMUNITY): Payer: Medicare Other

## 2017-09-13 DIAGNOSIS — E119 Type 2 diabetes mellitus without complications: Secondary | ICD-10-CM | POA: Diagnosis not present

## 2017-09-13 DIAGNOSIS — G629 Polyneuropathy, unspecified: Secondary | ICD-10-CM | POA: Diagnosis not present

## 2017-09-13 DIAGNOSIS — R072 Precordial pain: Secondary | ICD-10-CM | POA: Diagnosis not present

## 2017-09-13 DIAGNOSIS — N183 Chronic kidney disease, stage 3 (moderate): Secondary | ICD-10-CM | POA: Diagnosis not present

## 2017-09-13 DIAGNOSIS — R079 Chest pain, unspecified: Secondary | ICD-10-CM | POA: Diagnosis not present

## 2017-09-13 DIAGNOSIS — E785 Hyperlipidemia, unspecified: Secondary | ICD-10-CM | POA: Diagnosis not present

## 2017-09-13 DIAGNOSIS — I10 Essential (primary) hypertension: Secondary | ICD-10-CM | POA: Diagnosis not present

## 2017-09-13 DIAGNOSIS — R0602 Shortness of breath: Secondary | ICD-10-CM

## 2017-09-13 DIAGNOSIS — I4510 Unspecified right bundle-branch block: Secondary | ICD-10-CM | POA: Diagnosis not present

## 2017-09-13 LAB — CBC WITH DIFFERENTIAL/PLATELET
Basophils Absolute: 0 10*3/uL (ref 0.0–0.1)
Basophils Relative: 1 %
EOS ABS: 0.2 10*3/uL (ref 0.0–0.7)
EOS PCT: 3 %
HCT: 40 % (ref 39.0–52.0)
HEMOGLOBIN: 13.2 g/dL (ref 13.0–17.0)
LYMPHS ABS: 1.3 10*3/uL (ref 0.7–4.0)
LYMPHS PCT: 18 %
MCH: 27.4 pg (ref 26.0–34.0)
MCHC: 33 g/dL (ref 30.0–36.0)
MCV: 83 fL (ref 78.0–100.0)
MONOS PCT: 10 %
Monocytes Absolute: 0.7 10*3/uL (ref 0.1–1.0)
Neutro Abs: 4.8 10*3/uL (ref 1.7–7.7)
Neutrophils Relative %: 68 %
PLATELETS: 158 10*3/uL (ref 150–400)
RBC: 4.82 MIL/uL (ref 4.22–5.81)
RDW: 14.3 % (ref 11.5–15.5)
WBC: 7 10*3/uL (ref 4.0–10.5)

## 2017-09-13 LAB — NM MYOCAR MULTI W/SPECT W/WALL MOTION / EF
CHL CUP MPHR: 152 {beats}/min
CHL CUP RESTING HR STRESS: 70 {beats}/min
CSEPPHR: 99 {beats}/min
Estimated workload: 1 METS
Exercise duration (min): 0 min
Exercise duration (sec): 0 s
Percent HR: 65 %

## 2017-09-13 LAB — COMPREHENSIVE METABOLIC PANEL
ALK PHOS: 54 U/L (ref 38–126)
ALT: 30 U/L (ref 17–63)
ANION GAP: 12 (ref 5–15)
AST: 25 U/L (ref 15–41)
Albumin: 3.9 g/dL (ref 3.5–5.0)
BUN: 24 mg/dL — ABNORMAL HIGH (ref 6–20)
CALCIUM: 9.5 mg/dL (ref 8.9–10.3)
CO2: 23 mmol/L (ref 22–32)
CREATININE: 1.62 mg/dL — AB (ref 0.61–1.24)
Chloride: 101 mmol/L (ref 101–111)
GFR calc Af Amer: 49 mL/min — ABNORMAL LOW (ref >60)
GFR, EST NON AFRICAN AMERICAN: 42 mL/min — AB (ref >60)
Glucose, Bld: 201 mg/dL — ABNORMAL HIGH (ref 65–99)
Potassium: 4 mmol/L (ref 3.5–5.1)
Sodium: 136 mmol/L (ref 135–145)
Total Bilirubin: 1.2 mg/dL (ref 0.3–1.2)
Total Protein: 7 g/dL (ref 6.5–8.1)

## 2017-09-13 LAB — ECHOCARDIOGRAM COMPLETE
Height: 68 in
Weight: 3440 oz

## 2017-09-13 LAB — CBG MONITORING, ED
Glucose-Capillary: 166 mg/dL — ABNORMAL HIGH (ref 65–99)
Glucose-Capillary: 203 mg/dL — ABNORMAL HIGH (ref 65–99)

## 2017-09-13 LAB — GLUCOSE, CAPILLARY: Glucose-Capillary: 244 mg/dL — ABNORMAL HIGH (ref 65–99)

## 2017-09-13 LAB — MAGNESIUM: MAGNESIUM: 1.9 mg/dL (ref 1.7–2.4)

## 2017-09-13 LAB — TROPONIN I
TROPONIN I: 0.03 ng/mL — AB (ref <0.03)
TROPONIN I: 0.03 ng/mL — AB (ref <0.03)

## 2017-09-13 LAB — PHOSPHORUS: Phosphorus: 4.2 mg/dL (ref 2.5–4.6)

## 2017-09-13 MED ORDER — ASPIRIN EC 81 MG PO TBEC: 81.0000 mg | DELAYED_RELEASE_TABLET | Freq: Every day | ORAL | 0 refills | Status: AC

## 2017-09-13 MED ORDER — NITROGLYCERIN 0.4 MG SL SUBL: 0.4000 mg | SUBLINGUAL_TABLET | SUBLINGUAL | 0 refills | Status: AC | PRN

## 2017-09-13 MED ORDER — TECHNETIUM TC 99M TETROFOSMIN IV KIT
30.0000 | PACK | Freq: Once | INTRAVENOUS | Status: AC | PRN
  Administered 2017-09-13: 30 via INTRAVENOUS

## 2017-09-13 MED ORDER — TECHNETIUM TC 99M TETROFOSMIN IV KIT
10.0000 | PACK | Freq: Once | INTRAVENOUS | Status: AC | PRN
  Administered 2017-09-13: 10 via INTRAVENOUS

## 2017-09-13 MED ORDER — REGADENOSON 0.4 MG/5ML IV SOLN
0.4000 mg | Freq: Once | INTRAVENOUS | Status: AC
  Administered 2017-09-13: 0.4 mg via INTRAVENOUS
  Filled 2017-09-13: qty 5

## 2017-09-13 MED ORDER — REGADENOSON 0.4 MG/5ML IV SOLN
INTRAVENOUS | Status: AC
  Filled 2017-09-13: qty 5

## 2017-09-13 NOTE — ED Notes (Signed)
No yellow labels available.

## 2017-09-13 NOTE — Progress Notes (Signed)
Progress Note  Patient Name: Grant Patterson Date of Encounter: 09/13/2017  Primary Cardiologist: Jenkins Rouge, MD  Subjective   No chest pain overnight.  NPO for MV this AM.  Trop 0.03 @ 5am. Repeat pending.  Inpatient Medications    Scheduled Meds: . enoxaparin (LOVENOX) injection  40 mg Subcutaneous Q24H  . insulin aspart  0-5 Units Subcutaneous QHS  . insulin aspart  0-9 Units Subcutaneous TID WC  . lidocaine  1 patch Transdermal Q24H  . lisinopril  20 mg Oral BID  . pravastatin  10 mg Oral q1800  . pregabalin  75 mg Oral QHS  . regadenoson  0.4 mg Intravenous Once   Continuous Infusions:  PRN Meds: acetaminophen, fluticasone, hydrALAZINE, nitroGLYCERIN, ondansetron (ZOFRAN) IV   Vital Signs    Vitals:   09/13/17 0500 09/13/17 0600 09/13/17 0700 09/13/17 0800  BP: (!) 143/72 (!) 117/98 (!) 100/56 114/72  Pulse: 71 69 65 67  Resp: 14 15 15 13   Temp:      TempSrc:      SpO2: 91% 94% 94% 93%  Weight:      Height:        Intake/Output Summary (Last 24 hours) at 09/13/2017 1046 Last data filed at 09/12/2017 1819 Gross per 24 hour  Intake -  Output 400 ml  Net -400 ml   Filed Weights   09/12/17 1318  Weight: 215 lb (97.5 kg)    Physical Exam   GEN: Obese, in no acute distress.  HEENT: Grossly normal.  Neck: Supple, obese, difficult to gauge JVP, no carotid bruits, or masses. Cardiac: RRR, no murmurs, rubs, or gallops. No clubbing, cyanosis, 1+ bilat LE edema.  Radials/DP/PT 2+ and equal bilaterally.  Respiratory:  Respirations regular and unlabored, clear to auscultation bilaterally. GI: Soft, nontender, nondistended, BS + x 4. MS: no deformity or atrophy. Skin: warm and dry, no rash. Neuro:  Strength and sensation are intact. Psych: AAOx3.  Normal affect.  Labs    Chemistry Recent Labs  Lab 09/12/17 1354  NA 138  K 4.2  CL 104  CO2 25  GLUCOSE 155*  BUN 14  CREATININE 1.45*  CALCIUM 10.1  GFRNONAA 48*  GFRAA 56*  ANIONGAP 9      Hematology Recent Labs  Lab 09/12/17 1354  WBC 7.4  RBC 4.66  HGB 12.8*  HCT 38.5*  MCV 82.6  MCH 27.5  MCHC 33.2  RDW 14.2  PLT 172    Cardiac Enzymes Recent Labs  Lab 09/12/17 1730 09/12/17 2310 09/13/17 0509  TROPONINI <0.03 <0.03 0.03*    Recent Labs  Lab 09/12/17 1337  TROPIPOC 0.03     BNP Recent Labs  Lab 09/12/17 1344  BNP 165.8*     DDimer  Recent Labs  Lab 09/12/17 1507  DDIMER 0.42     Radiology    Dg Chest 2 View  Result Date: 09/12/2017 CLINICAL DATA:  Chest pain short of breath EXAM: CHEST  2 VIEW COMPARISON:  12/23/2015 FINDINGS: The heart size and mediastinal contours are within normal limits. Both lungs are clear. The visualized skeletal structures are unremarkable. IMPRESSION: No active cardiopulmonary disease. Electronically Signed   By: Franchot Gallo M.D.   On: 09/12/2017 14:05    Telemetry    RSR - Personally Reviewed  ECG    09/12/2017 15:33: Rsr, 70, LAD, LAFB, RBBB, LVH - Personally Reviewed  Cardiac Studies   MV pending  Patient Profile     67 y.o. male w/ a  h/o HTN, DM, and HL admitted 1/4 for chest pain and sob.  Assessment & Plan    1. Atypical Chest pain: Admitted 1/5 with a 3 day h/o left upper chest pain and elevated BPs.  No chest pain this AM.  Trop < 0.03 x 2 and then 0.03 @ 5AM.  Will repeat now.  If stable/nl, will proceed with lexiscan MV this AM.  2.  Essential HTN: stable on ACEI.  3.  DMII: A1c 7.8.  Per IM.  4. HL: LDL  62.  Cont statin.  Signed, Murray Hodgkins, NP  09/13/2017, 10:46 AM    For questions or updates, please contact   Please consult [email or web address] for contact info under Cardiology/STEMI.  Patient seen and examined independently. Emeline Gins, NP note reviewed carefully - agree with his assessment and plan. I have edited the note based on my findings.   Chest pain very atypical. Now resolved. ECG and troponin are normal.   I reviewed Myoview images personally and there is  minimal reversible defect in the base of the inferior wall which I thing is diaphragmatic attenuation. EF 49%. Ok from our standpoint for d/c. Will need aggressive RF management.   Glori Bickers, MD  2:45 PM

## 2017-09-13 NOTE — ED Notes (Signed)
Patient is sleeping with call bell in reach

## 2017-09-13 NOTE — Discharge Summary (Signed)
Physician Discharge Summary  Grant Patterson EYC:[id number] DOB: 20-Mar-1951 DOA: 09/12/2017  PCP: Mateo Flow, MD  Admit date: 09/12/2017 Discharge date: 09/13/2017  Admitted From: Home Disposition: Home  Recommendations for Outpatient Follow-up:  1. Follow up with PCP in 1-2 weeks 2. Follow up with Cardiology within 1-2 weeks 3. Please obtain CMP/CBC, Mag, Phos in one week 4. Please follow up on the following pending results:  Home Health: No Equipment/Devices: None   Discharge Condition: Stable CODE STATUS: FULL CODE Diet recommendation: Heart Healthy Carb Modified Diet  Brief/Interim Summary: Grant Patterson is a 67 y.o. male with medical history significant for diabetes mellitus on combination insulin and oral agents (diabetes greater than 25 years), difficult to control hypertension, peripheral neuropathy, chronic kidney disease stage III and dyslipidemia as well as other comorbids.  Patient reported that about 1-2 weeks ago he began having soreness in his left chest that was primarily reproducible with palpation directly over chest wall and upper extremity movement only.  He was not having typical exertional chest pain.  Over the past 3-4 days he has been experiencing reflux and has taken Tums with minimal relief.  In the past 48 hours he has taken 1 BC powder and a total of 6-8 Aleve.  He has not had any dark or bloody stools.    For about 2 to months he has had unexplained coughing and has seen multiple providers without any improvement in symptoms.  Over the past 2 weeks he has had increasing pedal edema especially at night.  He has never smoked. Yesterday because of persistent left-sided chest discomfort he presented initially to an urgent care in [place].    EMS performed EKG and noted the patient have ST segment elevation isolated to lead V2 therefore he was sent to the [facility] ER for further evaluation and treatment. Upon evaluation here patient was found to have underlying right  bundle branch block and previously noted ST segment elevation in V2 more consistent with J-point elevation in early repolarization.  Initial troponin unremarkable.  D-dimer negative at 0.42, BNP slightly elevated at 166 in the context of chronic kidney disease with a GFR 48.  Chest x-ray negative for edema or infiltrate and patient is not hypoxemic. Patient was observed for Chest Pain rule out and Cardiology evaluated and took the patient for a Myoview Stress Test which was read as intermediate. ECHOCardiogram was done and showed EF of 55-60%. Cardiology felt it was ok to D/C Patient Home and advised aggressive Risk Factor Management. He was started on ASA 81 mg and deemed stable to D/C home.   Discharge Diagnoses:  Principal Problem:   Chest pain Active Problems:   Neuropathy   Diabetes mellitus without complication (HCC)   Hypertension   Hyperlipidemia   RBBB   CKD (chronic kidney disease), stage III (HCC)  Atypical Chest pain r/o ACS -Patient presented with atypical chest pain that is reproducible with palpation; more concerning is 2 months of unexplained cough with negative chest x-ray and mildly elevated BNP and trace persistent pedal edema -Heart score = 6 -Echocardiogram showed EF of 55-60% and Grade 1 DD -Cycled troponin and was flat -Given lack of hypoxemia or abnormal chest x-ray, not convinced this is a true heart failure exacerbation therefore will not give additional Lasix.  Suspect BNP elevation related to low GFR in context of chronic kidney disease -Cardiology consulted and patient under went Myoview stress test which Cardiology evaluated and Dr. Haroldine Laws interpreted minimal reversible defect in the  base of the inferior wall with an EF of 49% -Patient reports had remote cardiac catheterization about 15-20 years ago at [facility] -C/w readmission beta-blocker and statin -NTG SL prn chest pain given at D/C -No NSAIDs for musculoskeletal component secondary to underlying  chronic kidney disease-patient did take 1 BC Powder and several Aleve over the past few weeks for symptoms -GI cocktail x1 -Started Patient on ASA 81 after discussion with Cardiology; Patient deemed stable to follow up with Cardiology as an outpatient   RBBB -Unknown if old or new -Follow-up on ECHOCardiogram as below -Follow up with Cardiology as an outpatient   Diabetes mellitus without complication  -Held Metformin acutely -Hold Toujeo since will be NPO after midnight -Follow CBGs/provide SSI -HbA1c was 7.8 -CBG's ranging from 166-244  Hypertension -Better controlled now -On Lisinopril, metoprolol and HCTZ prior to admission -Held HCTZ for now -Renal function at baseline per patient report (see below) so we will continue ACE inhibitor -Beta blocker held for Myoview Stress test but ok to resume -Hydralazine IV prn while hospitalized  -Will defer to Cardiology to start Amlodipine in the outpatient setting.   CKD (chronic kidney disease), stage III -Reports baseline creatinine 1.46 -Obtained Urinalysis and showed 30 Protein -Suspect patient has evolving Diabetic nephropathy with a hypertensive component -BUN/Cr now 24/1.62 -Follow up with PCP as an outpatient   Neuropathy -Continue preadmission Lyric 75 mg a at at bedtime  Hyperlipidemia -Lipid Panel showed Cholesterol of 113, HDL of 34, LDL of 52, TG of 85 -Continue Pravastatin 10 mg po qHS   Discharge Instructions  Discharge Instructions    Call MD for:  difficulty breathing, headache or visual disturbances   Complete by:  As directed    Call MD for:  extreme fatigue   Complete by:  As directed    Call MD for:  hives   Complete by:  As directed    Call MD for:  persistant dizziness or light-headedness   Complete by:  As directed    Call MD for:  persistant nausea and vomiting   Complete by:  As directed    Call MD for:  redness, tenderness, or signs of infection (pain, swelling, redness, odor or  green/yellow discharge around incision site)   Complete by:  As directed    Call MD for:  severe uncontrolled pain   Complete by:  As directed    Call MD for:  temperature >100.4   Complete by:  As directed    Diet - low sodium heart healthy   Complete by:  As directed    Diet Carb Modified   Complete by:  As directed    Discharge instructions   Complete by:  As directed    Follow up with PCP and with Cardiology as an outpatient. Take all medications as prescribed. If Symptoms change or worsen please return to the ED for evaluation.   Increase activity slowly   Complete by:  As directed      Allergies as of 09/13/2017   No Known Allergies     Medication List    TAKE these medications   acetaminophen 325 MG tablet Commonly known as:  TYLENOL Take 650 mg by mouth every 6 (six) hours as needed for mild pain.   aspirin EC 81 MG tablet Take 1 tablet (81 mg total) by mouth daily.   fluticasone 50 MCG/ACT nasal spray Commonly known as:  FLONASE Place 1 spray into both nostrils daily as needed for allergies or rhinitis.  hydrochlorothiazide 25 MG tablet Commonly known as:  HYDRODIURIL Take 12.5 mg by mouth daily.   insulin lispro 100 UNIT/ML injection Commonly known as:  HUMALOG Inject 6 Units into the skin 3 (three) times daily before meals.   JANUVIA 50 MG tablet Generic drug:  sitaGLIPtin Take 50 mg by mouth daily.   lisinopril 20 MG tablet Commonly known as:  PRINIVIL,ZESTRIL Take 20 mg by mouth 2 (two) times daily.   lovastatin 10 MG tablet Commonly known as:  MEVACOR Take 10 mg by mouth.   LYRICA 75 MG capsule Generic drug:  pregabalin Take 75 mg by mouth.   metFORMIN 750 MG 24 hr tablet Commonly known as:  GLUCOPHAGE-XR Take 750 mg by mouth 2 (two) times daily.   Metoprolol Succinate 100 MG Cs24 Take 100 mg by mouth daily.   nitroGLYCERIN 0.4 MG SL tablet Commonly known as:  NITROSTAT Place 1 tablet (0.4 mg total) under the tongue every 5 (five)  minutes as needed for chest pain.   TOUJEO SOLOSTAR 300 UNIT/ML Sopn Generic drug:  Insulin Glargine Inject 80 Units into the skin daily.      Follow-up Information    Mateo Flow, MD. Call.   Specialty:  Family Medicine Why:  Follow up with PCP within 1 week  Contact information: [place] [address] [phone]        Josue Hector, MD .   Specialty:  Cardiology Contact information: [address] [place] [address] [phone]          No Known Allergies  Consultations:  Cardiology  Procedures/Studies: Dg Chest 2 View  Result Date: 09/12/2017 CLINICAL DATA:  Chest pain short of breath EXAM: CHEST  2 VIEW COMPARISON:  12/23/2015 FINDINGS: The heart size and mediastinal contours are within normal limits. Both lungs are clear. The visualized skeletal structures are unremarkable. IMPRESSION: No active cardiopulmonary disease. Electronically Signed   By: Franchot Gallo M.D.   On: 09/12/2017 14:05   Nm Myocar Multi W/spect W/wall Motion / Ef  Result Date: 09/13/2017 CLINICAL DATA:  Chest pain EXAM: MYOCARDIAL IMAGING WITH SPECT (REST AND PHARMACOLOGIC-STRESS - 2 DAY PROTOCOL) GATED LEFT VENTRICULAR WALL MOTION STUDY LEFT VENTRICULAR EJECTION FRACTION TECHNIQUE: Standard myocardial SPECT imaging was performed after resting intravenous injection of 10 mCi Tc-52m tetrofosmin. Subsequently, on a second day, intravenous infusion of Lexiscan was performed under the supervision of the Cardiology staff. At peak effect of the drug, 30 mCi Tc-4m tetrofosmin was injected intravenously and standard myocardial SPECT imaging was performed. Quantitative gated imaging was also performed to evaluate left ventricular wall motion, and estimate left ventricular ejection fraction. COMPARISON:  None. FINDINGS: Perfusion: No decreased activity in the left ventricle on stress imaging to suggest reversible ischemia or infarction. Wall Motion: Normal left  ventricular wall motion. No left ventricular dilation. Left Ventricular Ejection Fraction: 49 % End diastolic volume 696 ml End systolic volume 65 ml IMPRESSION: 1. No reversible ischemia or infarction. 2. Normal left ventricular wall motion. 3. Left ventricular ejection fraction 49% 4. Non invasive risk stratification*: Intermediate, due to an EF just below the normal range. *2012 Appropriate Use Criteria for Coronary Revascularization Focused Update: J Am Coll Cardiol. 7893;81(0):175-102. http://content.[email or web address].aspx?articleid=[email or web address] Electronically Signed   By: Julian Hy M.D.   On: 09/13/2017 15:12   ECHOCARDIOGRAM ------------------------------------------------------------------- Study Conclusions  - Left ventricle: The cavity size was normal. Systolic function was   normal. The estimated ejection fraction was in the range of 55%  to 60%. Wall motion was normal; there were no regional wall   motion abnormalities. Doppler parameters are consistent with   abnormal left ventricular relaxation (grade 1 diastolic   dysfunction). - Aortic valve: Mildly calcified annulus. Trileaflet; normal   thickness leaflets. - Left atrium: The atrium was moderately dilated.  MYOVIEW STRESS TEST FINDINGS: Perfusion: No decreased activity in the left ventricle on stress imaging to suggest reversible ischemia or infarction.  Wall Motion: Normal left ventricular wall motion. No left ventricular dilation.  Left Ventricular Ejection Fraction: 49 %  End diastolic volume 951 ml  End systolic volume 65 ml  IMPRESSION: 1. No reversible ischemia or infarction.  2. Normal left ventricular wall motion.  3. Left ventricular ejection fraction 49%  4. Non invasive risk stratification*: Intermediate, due to an EF just below the normal range.  Subjective: Seen and examined prior to Myoview and Chest Pain resolved. No Nausea or vomiting. No other concerns or complaints or  concerns.  Discharge Exam: Vitals:   09/13/17 1626 09/13/17 1724  BP: (!) 149/73   Pulse: 82 80  Resp: 20   Temp: 97.6 F (36.4 C)   SpO2: 100% 96%   Vitals:   09/13/17 1228 09/13/17 1500 09/13/17 1626 09/13/17 1724  BP: (!) 151/76 132/80 (!) 149/73   Pulse: 91 80 82 80  Resp:  18 20   Temp:   97.6 F (36.4 C)   TempSrc:   Oral   SpO2:  96% 100% 96%  Weight:   94 kg (207 lb 3.7 oz)   Height:   5\' 8"  (1.727 m)    General: Pt is alert, slightly somnolent but arousable Caucasian obese male, not in acute distress Cardiovascular: RRR, S1/S2 +, no rubs, no gallops Respiratory: Diminished bilaterally, no wheezing, no rhonchi Abdominal: Soft, NT, ND, bowel sounds + Extremities: no appreciable edema, no cyanosis  The results of significant diagnostics from this hospitalization (including imaging, microbiology, ancillary and laboratory) are listed below for reference.    Microbiology: No results found for this or any previous visit (from the past 240 hour(s)).   Labs: BNP (last 3 results) Recent Labs    09/12/17 1344  BNP 884.1*   Basic Metabolic Panel: Recent Labs  Lab 09/12/17 1354 09/13/17 1335  NA 138 136  K 4.2 4.0  CL 104 101  CO2 25 23  GLUCOSE 155* 201*  BUN 14 24*  CREATININE 1.45* 1.62*  CALCIUM 10.1 9.5  MG  --  1.9  PHOS  --  4.2   Liver Function Tests: Recent Labs  Lab 09/13/17 1335  AST 25  ALT 30  ALKPHOS 54  BILITOT 1.2  PROT 7.0  ALBUMIN 3.9   No results for input(s): LIPASE, AMYLASE in the last 168 hours. No results for input(s): AMMONIA in the last 168 hours. CBC: Recent Labs  Lab 09/12/17 1354 09/13/17 1335  WBC 7.4 7.0  NEUTROABS  --  4.8  HGB 12.8* 13.2  HCT 38.5* 40.0  MCV 82.6 83.0  PLT 172 158   Cardiac Enzymes: Recent Labs  Lab 09/12/17 1730 09/12/17 2310 09/13/17 0509 09/13/17 1335  CKTOTAL 222  --   --   --   TROPONINI <0.03 <0.03 0.03* 0.03*   BNP: Invalid input(s): POCBNP CBG: Recent Labs  Lab  09/12/17 2016 09/12/17 2252 09/13/17 0728 09/13/17 1319 09/13/17 1629  GLUCAP 134* 208* 166* 203* 244*   D-Dimer Recent Labs    09/12/17 1507  DDIMER 0.42   Hgb A1c Recent  Labs    09/12/17 1730  HGBA1C 7.8*   Lipid Profile Recent Labs    09/12/17 1730  CHOL 113  HDL 34*  LDLCALC 62  TRIG 85  CHOLHDL 3.3   Thyroid function studies No results for input(s): TSH, T4TOTAL, T3FREE, THYROIDAB in the last 72 hours.  Invalid input(s): FREET3 Anemia work up No results for input(s): VITAMINB12, FOLATE, FERRITIN, TIBC, IRON, RETICCTPCT in the last 72 hours. Urinalysis    Component Value Date/Time   COLORURINE STRAW (A) 09/12/2017 1852   APPEARANCEUR CLEAR 09/12/2017 1852   LABSPEC 1.006 09/12/2017 1852   PHURINE 5.0 09/12/2017 1852   GLUCOSEU NEGATIVE 09/12/2017 1852   HGBUR SMALL (A) 09/12/2017 1852   BILIRUBINUR NEGATIVE 09/12/2017 1852   KETONESUR NEGATIVE 09/12/2017 1852   PROTEINUR 30 (A) 09/12/2017 1852   NITRITE NEGATIVE 09/12/2017 1852   LEUKOCYTESUR NEGATIVE 09/12/2017 1852   Sepsis Labs Invalid input(s): PROCALCITONIN,  WBC,  LACTICIDVEN Microbiology No results found for this or any previous visit (from the past 240 hour(s)).  Time coordinating discharge: 35 minutes  SIGNED:  Kerney Elbe, DO [facility] 09/13/2017, 8:49 PM Pager [phone]  If 7PM-7AM, please contact night-coverage [email or web address] Password [email or web address]

## 2017-09-13 NOTE — Progress Notes (Signed)
  Echocardiogram 2D Echocardiogram has been performed.  Grant Patterson 09/13/2017, 4:07 PM

## 2017-09-13 NOTE — Progress Notes (Signed)
   Grant Patterson presented for a lexiscan cardiolite today.  No immediate complications.  Stress imaging is pending at this time.  Murray Hodgkins, NP 09/13/2017, 12:33 PM

## 2017-09-13 NOTE — ED Notes (Signed)
Checked patient blood sugar it ws 166 notified RN of blood sugar

## 2017-09-15 LAB — GLUCOSE, CAPILLARY: GLUCOSE-CAPILLARY: 164 mg/dL — AB (ref 65–99)

## 2017-09-17 DIAGNOSIS — I10 Essential (primary) hypertension: Secondary | ICD-10-CM | POA: Diagnosis not present

## 2017-09-17 DIAGNOSIS — E1165 Type 2 diabetes mellitus with hyperglycemia: Secondary | ICD-10-CM | POA: Diagnosis not present

## 2017-10-01 DIAGNOSIS — I10 Essential (primary) hypertension: Secondary | ICD-10-CM | POA: Diagnosis not present

## 2017-11-07 DIAGNOSIS — H2513 Age-related nuclear cataract, bilateral: Secondary | ICD-10-CM | POA: Diagnosis not present

## 2017-11-07 DIAGNOSIS — H5203 Hypermetropia, bilateral: Secondary | ICD-10-CM | POA: Diagnosis not present

## 2017-11-07 DIAGNOSIS — E113413 Type 2 diabetes mellitus with severe nonproliferative diabetic retinopathy with macular edema, bilateral: Secondary | ICD-10-CM | POA: Diagnosis not present

## 2017-12-09 DIAGNOSIS — E113312 Type 2 diabetes mellitus with moderate nonproliferative diabetic retinopathy with macular edema, left eye: Secondary | ICD-10-CM | POA: Diagnosis not present

## 2017-12-17 DIAGNOSIS — E113311 Type 2 diabetes mellitus with moderate nonproliferative diabetic retinopathy with macular edema, right eye: Secondary | ICD-10-CM | POA: Diagnosis not present

## 2017-12-17 DIAGNOSIS — E113313 Type 2 diabetes mellitus with moderate nonproliferative diabetic retinopathy with macular edema, bilateral: Secondary | ICD-10-CM | POA: Diagnosis not present

## 2018-01-15 DIAGNOSIS — E113311 Type 2 diabetes mellitus with moderate nonproliferative diabetic retinopathy with macular edema, right eye: Secondary | ICD-10-CM | POA: Diagnosis not present

## 2018-03-03 DIAGNOSIS — M533 Sacrococcygeal disorders, not elsewhere classified: Secondary | ICD-10-CM | POA: Diagnosis not present

## 2018-04-09 DIAGNOSIS — E113312 Type 2 diabetes mellitus with moderate nonproliferative diabetic retinopathy with macular edema, left eye: Secondary | ICD-10-CM | POA: Diagnosis not present

## 2018-05-20 DIAGNOSIS — E1165 Type 2 diabetes mellitus with hyperglycemia: Secondary | ICD-10-CM | POA: Diagnosis not present

## 2018-05-21 DIAGNOSIS — Z23 Encounter for immunization: Secondary | ICD-10-CM | POA: Diagnosis not present

## 2018-05-21 DIAGNOSIS — Z0000 Encounter for general adult medical examination without abnormal findings: Secondary | ICD-10-CM | POA: Diagnosis not present

## 2018-05-21 DIAGNOSIS — E1140 Type 2 diabetes mellitus with diabetic neuropathy, unspecified: Secondary | ICD-10-CM | POA: Diagnosis not present

## 2018-05-21 DIAGNOSIS — N183 Chronic kidney disease, stage 3 (moderate): Secondary | ICD-10-CM | POA: Diagnosis not present

## 2018-05-21 DIAGNOSIS — I10 Essential (primary) hypertension: Secondary | ICD-10-CM | POA: Diagnosis not present

## 2018-05-21 DIAGNOSIS — E1165 Type 2 diabetes mellitus with hyperglycemia: Secondary | ICD-10-CM | POA: Diagnosis not present

## 2018-06-11 DIAGNOSIS — E113411 Type 2 diabetes mellitus with severe nonproliferative diabetic retinopathy with macular edema, right eye: Secondary | ICD-10-CM | POA: Diagnosis not present

## 2018-06-11 DIAGNOSIS — E113413 Type 2 diabetes mellitus with severe nonproliferative diabetic retinopathy with macular edema, bilateral: Secondary | ICD-10-CM | POA: Diagnosis not present

## 2018-06-11 DIAGNOSIS — E113412 Type 2 diabetes mellitus with severe nonproliferative diabetic retinopathy with macular edema, left eye: Secondary | ICD-10-CM | POA: Diagnosis not present

## 2018-07-16 DIAGNOSIS — E113412 Type 2 diabetes mellitus with severe nonproliferative diabetic retinopathy with macular edema, left eye: Secondary | ICD-10-CM | POA: Diagnosis not present

## 2018-08-05 DIAGNOSIS — J0100 Acute maxillary sinusitis, unspecified: Secondary | ICD-10-CM | POA: Diagnosis not present

## 2018-08-20 DIAGNOSIS — E113413 Type 2 diabetes mellitus with severe nonproliferative diabetic retinopathy with macular edema, bilateral: Secondary | ICD-10-CM | POA: Diagnosis not present

## 2018-08-20 DIAGNOSIS — E113412 Type 2 diabetes mellitus with severe nonproliferative diabetic retinopathy with macular edema, left eye: Secondary | ICD-10-CM | POA: Diagnosis not present

## 2018-08-26 DIAGNOSIS — J22 Unspecified acute lower respiratory infection: Secondary | ICD-10-CM | POA: Diagnosis not present

## 2018-11-05 DIAGNOSIS — E113413 Type 2 diabetes mellitus with severe nonproliferative diabetic retinopathy with macular edema, bilateral: Secondary | ICD-10-CM | POA: Diagnosis not present

## 2018-11-05 DIAGNOSIS — E113412 Type 2 diabetes mellitus with severe nonproliferative diabetic retinopathy with macular edema, left eye: Secondary | ICD-10-CM | POA: Diagnosis not present

## 2018-12-17 DIAGNOSIS — I10 Essential (primary) hypertension: Secondary | ICD-10-CM | POA: Diagnosis not present

## 2018-12-17 DIAGNOSIS — D649 Anemia, unspecified: Secondary | ICD-10-CM | POA: Diagnosis not present

## 2018-12-17 DIAGNOSIS — E1165 Type 2 diabetes mellitus with hyperglycemia: Secondary | ICD-10-CM | POA: Diagnosis not present

## 2018-12-17 DIAGNOSIS — N183 Chronic kidney disease, stage 3 (moderate): Secondary | ICD-10-CM | POA: Diagnosis not present

## 2019-01-21 DIAGNOSIS — E113412 Type 2 diabetes mellitus with severe nonproliferative diabetic retinopathy with macular edema, left eye: Secondary | ICD-10-CM | POA: Diagnosis not present

## 2019-02-26 DIAGNOSIS — Z01818 Encounter for other preprocedural examination: Secondary | ICD-10-CM | POA: Diagnosis not present

## 2019-02-26 DIAGNOSIS — H2512 Age-related nuclear cataract, left eye: Secondary | ICD-10-CM | POA: Diagnosis not present

## 2019-02-26 DIAGNOSIS — E113412 Type 2 diabetes mellitus with severe nonproliferative diabetic retinopathy with macular edema, left eye: Secondary | ICD-10-CM | POA: Diagnosis not present

## 2019-03-08 DIAGNOSIS — H25812 Combined forms of age-related cataract, left eye: Secondary | ICD-10-CM | POA: Diagnosis not present

## 2019-03-08 DIAGNOSIS — H2512 Age-related nuclear cataract, left eye: Secondary | ICD-10-CM | POA: Diagnosis not present

## 2019-03-31 DIAGNOSIS — E113412 Type 2 diabetes mellitus with severe nonproliferative diabetic retinopathy with macular edema, left eye: Secondary | ICD-10-CM | POA: Diagnosis not present

## 2019-04-05 DIAGNOSIS — E1165 Type 2 diabetes mellitus with hyperglycemia: Secondary | ICD-10-CM | POA: Diagnosis not present

## 2019-04-09 DIAGNOSIS — N183 Chronic kidney disease, stage 3 (moderate): Secondary | ICD-10-CM | POA: Diagnosis not present

## 2019-04-09 DIAGNOSIS — I10 Essential (primary) hypertension: Secondary | ICD-10-CM | POA: Diagnosis not present

## 2019-04-09 DIAGNOSIS — E1140 Type 2 diabetes mellitus with diabetic neuropathy, unspecified: Secondary | ICD-10-CM | POA: Diagnosis not present

## 2019-05-05 DIAGNOSIS — I10 Essential (primary) hypertension: Secondary | ICD-10-CM | POA: Diagnosis not present

## 2019-05-05 DIAGNOSIS — E1165 Type 2 diabetes mellitus with hyperglycemia: Secondary | ICD-10-CM | POA: Diagnosis not present

## 2019-05-10 DIAGNOSIS — I10 Essential (primary) hypertension: Secondary | ICD-10-CM | POA: Diagnosis not present

## 2019-05-10 DIAGNOSIS — E1140 Type 2 diabetes mellitus with diabetic neuropathy, unspecified: Secondary | ICD-10-CM | POA: Diagnosis not present

## 2019-05-20 DIAGNOSIS — E113312 Type 2 diabetes mellitus with moderate nonproliferative diabetic retinopathy with macular edema, left eye: Secondary | ICD-10-CM | POA: Diagnosis not present

## 2019-05-26 DIAGNOSIS — N183 Chronic kidney disease, stage 3 (moderate): Secondary | ICD-10-CM | POA: Diagnosis not present

## 2019-05-26 DIAGNOSIS — K760 Fatty (change of) liver, not elsewhere classified: Secondary | ICD-10-CM | POA: Diagnosis not present

## 2019-06-09 DIAGNOSIS — E1165 Type 2 diabetes mellitus with hyperglycemia: Secondary | ICD-10-CM | POA: Diagnosis not present

## 2019-06-09 DIAGNOSIS — E1129 Type 2 diabetes mellitus with other diabetic kidney complication: Secondary | ICD-10-CM | POA: Diagnosis not present

## 2019-06-09 DIAGNOSIS — E1140 Type 2 diabetes mellitus with diabetic neuropathy, unspecified: Secondary | ICD-10-CM | POA: Diagnosis not present

## 2019-06-09 DIAGNOSIS — I10 Essential (primary) hypertension: Secondary | ICD-10-CM | POA: Diagnosis not present

## 2019-06-24 DIAGNOSIS — E113312 Type 2 diabetes mellitus with moderate nonproliferative diabetic retinopathy with macular edema, left eye: Secondary | ICD-10-CM | POA: Diagnosis not present

## 2019-07-10 DIAGNOSIS — E1165 Type 2 diabetes mellitus with hyperglycemia: Secondary | ICD-10-CM | POA: Diagnosis not present

## 2019-07-10 DIAGNOSIS — I10 Essential (primary) hypertension: Secondary | ICD-10-CM | POA: Diagnosis not present

## 2019-07-10 DIAGNOSIS — N183 Chronic kidney disease, stage 3 unspecified: Secondary | ICD-10-CM | POA: Diagnosis not present

## 2019-09-20 DIAGNOSIS — E113312 Type 2 diabetes mellitus with moderate nonproliferative diabetic retinopathy with macular edema, left eye: Secondary | ICD-10-CM | POA: Diagnosis not present

## 2019-09-20 DIAGNOSIS — E113313 Type 2 diabetes mellitus with moderate nonproliferative diabetic retinopathy with macular edema, bilateral: Secondary | ICD-10-CM | POA: Diagnosis not present

## 2019-09-24 DIAGNOSIS — L720 Epidermal cyst: Secondary | ICD-10-CM | POA: Diagnosis not present

## 2019-09-30 DIAGNOSIS — H9201 Otalgia, right ear: Secondary | ICD-10-CM | POA: Diagnosis not present

## 2019-09-30 DIAGNOSIS — J329 Chronic sinusitis, unspecified: Secondary | ICD-10-CM | POA: Diagnosis not present

## 2019-10-07 DIAGNOSIS — E113213 Type 2 diabetes mellitus with mild nonproliferative diabetic retinopathy with macular edema, bilateral: Secondary | ICD-10-CM | POA: Diagnosis not present

## 2019-10-07 DIAGNOSIS — H2511 Age-related nuclear cataract, right eye: Secondary | ICD-10-CM | POA: Diagnosis not present

## 2019-12-21 DIAGNOSIS — R935 Abnormal findings on diagnostic imaging of other abdominal regions, including retroperitoneum: Secondary | ICD-10-CM | POA: Diagnosis not present

## 2019-12-21 DIAGNOSIS — R1031 Right lower quadrant pain: Secondary | ICD-10-CM | POA: Diagnosis not present

## 2019-12-21 DIAGNOSIS — E1165 Type 2 diabetes mellitus with hyperglycemia: Secondary | ICD-10-CM | POA: Diagnosis not present

## 2019-12-21 DIAGNOSIS — R109 Unspecified abdominal pain: Secondary | ICD-10-CM | POA: Diagnosis not present

## 2019-12-21 DIAGNOSIS — Z6833 Body mass index (BMI) 33.0-33.9, adult: Secondary | ICD-10-CM | POA: Diagnosis not present

## 2019-12-28 DIAGNOSIS — Z6832 Body mass index (BMI) 32.0-32.9, adult: Secondary | ICD-10-CM | POA: Diagnosis not present

## 2019-12-28 DIAGNOSIS — E113299 Type 2 diabetes mellitus with mild nonproliferative diabetic retinopathy without macular edema, unspecified eye: Secondary | ICD-10-CM | POA: Diagnosis not present

## 2019-12-28 DIAGNOSIS — Z1331 Encounter for screening for depression: Secondary | ICD-10-CM | POA: Diagnosis not present

## 2019-12-28 DIAGNOSIS — Z0000 Encounter for general adult medical examination without abnormal findings: Secondary | ICD-10-CM | POA: Diagnosis not present

## 2019-12-28 DIAGNOSIS — Z9181 History of falling: Secondary | ICD-10-CM | POA: Diagnosis not present

## 2019-12-28 DIAGNOSIS — I10 Essential (primary) hypertension: Secondary | ICD-10-CM | POA: Diagnosis not present

## 2019-12-28 DIAGNOSIS — E1165 Type 2 diabetes mellitus with hyperglycemia: Secondary | ICD-10-CM | POA: Diagnosis not present

## 2019-12-28 DIAGNOSIS — K760 Fatty (change of) liver, not elsewhere classified: Secondary | ICD-10-CM | POA: Diagnosis not present

## 2019-12-28 DIAGNOSIS — N183 Chronic kidney disease, stage 3 unspecified: Secondary | ICD-10-CM | POA: Diagnosis not present

## 2020-03-02 DIAGNOSIS — E11621 Type 2 diabetes mellitus with foot ulcer: Secondary | ICD-10-CM | POA: Diagnosis not present

## 2020-03-02 DIAGNOSIS — Z6833 Body mass index (BMI) 33.0-33.9, adult: Secondary | ICD-10-CM | POA: Diagnosis not present

## 2020-03-02 DIAGNOSIS — L97519 Non-pressure chronic ulcer of other part of right foot with unspecified severity: Secondary | ICD-10-CM | POA: Diagnosis not present

## 2020-03-02 DIAGNOSIS — E1140 Type 2 diabetes mellitus with diabetic neuropathy, unspecified: Secondary | ICD-10-CM | POA: Diagnosis not present

## 2020-03-16 DIAGNOSIS — L97519 Non-pressure chronic ulcer of other part of right foot with unspecified severity: Secondary | ICD-10-CM | POA: Diagnosis not present

## 2020-03-16 DIAGNOSIS — K5904 Chronic idiopathic constipation: Secondary | ICD-10-CM | POA: Diagnosis not present

## 2020-03-16 DIAGNOSIS — E11621 Type 2 diabetes mellitus with foot ulcer: Secondary | ICD-10-CM | POA: Diagnosis not present

## 2020-03-16 DIAGNOSIS — B353 Tinea pedis: Secondary | ICD-10-CM | POA: Diagnosis not present

## 2020-03-16 DIAGNOSIS — Z6833 Body mass index (BMI) 33.0-33.9, adult: Secondary | ICD-10-CM | POA: Diagnosis not present

## 2020-06-01 DIAGNOSIS — M9903 Segmental and somatic dysfunction of lumbar region: Secondary | ICD-10-CM | POA: Diagnosis not present

## 2020-06-01 DIAGNOSIS — M545 Low back pain: Secondary | ICD-10-CM | POA: Diagnosis not present

## 2020-06-01 DIAGNOSIS — M9902 Segmental and somatic dysfunction of thoracic region: Secondary | ICD-10-CM | POA: Diagnosis not present

## 2020-06-01 DIAGNOSIS — M9905 Segmental and somatic dysfunction of pelvic region: Secondary | ICD-10-CM | POA: Diagnosis not present

## 2020-06-05 DIAGNOSIS — M9903 Segmental and somatic dysfunction of lumbar region: Secondary | ICD-10-CM | POA: Diagnosis not present

## 2020-06-05 DIAGNOSIS — M9905 Segmental and somatic dysfunction of pelvic region: Secondary | ICD-10-CM | POA: Diagnosis not present

## 2020-06-05 DIAGNOSIS — M9902 Segmental and somatic dysfunction of thoracic region: Secondary | ICD-10-CM | POA: Diagnosis not present

## 2020-06-05 DIAGNOSIS — M545 Low back pain: Secondary | ICD-10-CM | POA: Diagnosis not present

## 2020-06-07 DIAGNOSIS — M545 Low back pain: Secondary | ICD-10-CM | POA: Diagnosis not present

## 2020-06-07 DIAGNOSIS — M9903 Segmental and somatic dysfunction of lumbar region: Secondary | ICD-10-CM | POA: Diagnosis not present

## 2020-06-07 DIAGNOSIS — M9902 Segmental and somatic dysfunction of thoracic region: Secondary | ICD-10-CM | POA: Diagnosis not present

## 2020-06-07 DIAGNOSIS — M9905 Segmental and somatic dysfunction of pelvic region: Secondary | ICD-10-CM | POA: Diagnosis not present

## 2020-06-13 DIAGNOSIS — M545 Low back pain, unspecified: Secondary | ICD-10-CM | POA: Diagnosis not present

## 2020-06-13 DIAGNOSIS — M9905 Segmental and somatic dysfunction of pelvic region: Secondary | ICD-10-CM | POA: Diagnosis not present

## 2020-06-13 DIAGNOSIS — M9903 Segmental and somatic dysfunction of lumbar region: Secondary | ICD-10-CM | POA: Diagnosis not present

## 2020-06-13 DIAGNOSIS — M9902 Segmental and somatic dysfunction of thoracic region: Secondary | ICD-10-CM | POA: Diagnosis not present

## 2020-06-20 DIAGNOSIS — Z79899 Other long term (current) drug therapy: Secondary | ICD-10-CM | POA: Diagnosis not present

## 2020-06-20 DIAGNOSIS — M4309 Spondylolysis, multiple sites in spine: Secondary | ICD-10-CM | POA: Diagnosis not present

## 2020-06-20 DIAGNOSIS — J189 Pneumonia, unspecified organism: Secondary | ICD-10-CM | POA: Diagnosis not present

## 2020-06-20 DIAGNOSIS — M4319 Spondylolisthesis, multiple sites in spine: Secondary | ICD-10-CM | POA: Diagnosis not present

## 2020-06-20 DIAGNOSIS — R109 Unspecified abdominal pain: Secondary | ICD-10-CM | POA: Diagnosis not present

## 2020-06-20 DIAGNOSIS — E785 Hyperlipidemia, unspecified: Secondary | ICD-10-CM | POA: Diagnosis not present

## 2020-06-20 DIAGNOSIS — I4510 Unspecified right bundle-branch block: Secondary | ICD-10-CM | POA: Diagnosis not present

## 2020-06-20 DIAGNOSIS — E1142 Type 2 diabetes mellitus with diabetic polyneuropathy: Secondary | ICD-10-CM | POA: Diagnosis not present

## 2020-06-20 DIAGNOSIS — U071 COVID-19: Secondary | ICD-10-CM | POA: Diagnosis not present

## 2020-06-20 DIAGNOSIS — Z9049 Acquired absence of other specified parts of digestive tract: Secondary | ICD-10-CM | POA: Diagnosis not present

## 2020-06-20 DIAGNOSIS — Z794 Long term (current) use of insulin: Secondary | ICD-10-CM | POA: Diagnosis not present

## 2020-06-20 DIAGNOSIS — N170 Acute kidney failure with tubular necrosis: Secondary | ICD-10-CM | POA: Diagnosis not present

## 2020-06-20 DIAGNOSIS — E860 Dehydration: Secondary | ICD-10-CM | POA: Diagnosis not present

## 2020-06-20 DIAGNOSIS — J1282 Pneumonia due to coronavirus disease 2019: Secondary | ICD-10-CM | POA: Diagnosis not present

## 2020-06-20 DIAGNOSIS — A0839 Other viral enteritis: Secondary | ICD-10-CM | POA: Diagnosis not present

## 2020-06-20 DIAGNOSIS — I129 Hypertensive chronic kidney disease with stage 1 through stage 4 chronic kidney disease, or unspecified chronic kidney disease: Secondary | ICD-10-CM | POA: Diagnosis not present

## 2020-06-20 DIAGNOSIS — E1122 Type 2 diabetes mellitus with diabetic chronic kidney disease: Secondary | ICD-10-CM | POA: Diagnosis not present

## 2020-06-20 DIAGNOSIS — N179 Acute kidney failure, unspecified: Secondary | ICD-10-CM | POA: Diagnosis not present

## 2020-06-20 DIAGNOSIS — E7800 Pure hypercholesterolemia, unspecified: Secondary | ICD-10-CM | POA: Diagnosis not present

## 2020-06-20 DIAGNOSIS — E1165 Type 2 diabetes mellitus with hyperglycemia: Secondary | ICD-10-CM | POA: Diagnosis not present

## 2020-06-20 DIAGNOSIS — N183 Chronic kidney disease, stage 3 unspecified: Secondary | ICD-10-CM | POA: Diagnosis not present

## 2020-07-13 DIAGNOSIS — E1165 Type 2 diabetes mellitus with hyperglycemia: Secondary | ICD-10-CM | POA: Diagnosis not present

## 2020-07-13 DIAGNOSIS — N183 Chronic kidney disease, stage 3 unspecified: Secondary | ICD-10-CM | POA: Diagnosis not present

## 2020-07-13 DIAGNOSIS — Z8616 Personal history of COVID-19: Secondary | ICD-10-CM | POA: Diagnosis not present

## 2020-07-13 DIAGNOSIS — Z6831 Body mass index (BMI) 31.0-31.9, adult: Secondary | ICD-10-CM | POA: Diagnosis not present

## 2020-07-17 DIAGNOSIS — E1165 Type 2 diabetes mellitus with hyperglycemia: Secondary | ICD-10-CM | POA: Diagnosis not present

## 2020-07-18 DIAGNOSIS — E1165 Type 2 diabetes mellitus with hyperglycemia: Secondary | ICD-10-CM | POA: Diagnosis not present

## 2020-08-16 DIAGNOSIS — E1165 Type 2 diabetes mellitus with hyperglycemia: Secondary | ICD-10-CM | POA: Diagnosis not present

## 2020-09-16 DIAGNOSIS — E1165 Type 2 diabetes mellitus with hyperglycemia: Secondary | ICD-10-CM | POA: Diagnosis not present

## 2020-09-28 DIAGNOSIS — K5792 Diverticulitis of intestine, part unspecified, without perforation or abscess without bleeding: Secondary | ICD-10-CM | POA: Diagnosis not present

## 2020-09-28 DIAGNOSIS — K529 Noninfective gastroenteritis and colitis, unspecified: Secondary | ICD-10-CM | POA: Diagnosis not present

## 2020-09-28 DIAGNOSIS — N183 Chronic kidney disease, stage 3 unspecified: Secondary | ICD-10-CM | POA: Diagnosis not present

## 2020-09-28 DIAGNOSIS — Z6831 Body mass index (BMI) 31.0-31.9, adult: Secondary | ICD-10-CM | POA: Diagnosis not present

## 2020-09-28 DIAGNOSIS — Z8616 Personal history of COVID-19: Secondary | ICD-10-CM | POA: Diagnosis not present

## 2020-10-17 DIAGNOSIS — E1165 Type 2 diabetes mellitus with hyperglycemia: Secondary | ICD-10-CM | POA: Diagnosis not present

## 2020-11-14 DIAGNOSIS — E1165 Type 2 diabetes mellitus with hyperglycemia: Secondary | ICD-10-CM | POA: Diagnosis not present

## 2020-12-15 DIAGNOSIS — E1165 Type 2 diabetes mellitus with hyperglycemia: Secondary | ICD-10-CM | POA: Diagnosis not present

## 2021-01-14 DIAGNOSIS — E1165 Type 2 diabetes mellitus with hyperglycemia: Secondary | ICD-10-CM | POA: Diagnosis not present

## 2021-02-14 DIAGNOSIS — E1165 Type 2 diabetes mellitus with hyperglycemia: Secondary | ICD-10-CM | POA: Diagnosis not present

## 2021-03-16 DIAGNOSIS — E1165 Type 2 diabetes mellitus with hyperglycemia: Secondary | ICD-10-CM | POA: Diagnosis not present

## 2021-04-02 DIAGNOSIS — Z6832 Body mass index (BMI) 32.0-32.9, adult: Secondary | ICD-10-CM | POA: Diagnosis not present

## 2021-04-02 DIAGNOSIS — N183 Chronic kidney disease, stage 3 unspecified: Secondary | ICD-10-CM | POA: Diagnosis not present

## 2021-04-02 DIAGNOSIS — M545 Low back pain, unspecified: Secondary | ICD-10-CM | POA: Diagnosis not present

## 2021-04-02 DIAGNOSIS — E1140 Type 2 diabetes mellitus with diabetic neuropathy, unspecified: Secondary | ICD-10-CM | POA: Diagnosis not present

## 2021-04-16 DIAGNOSIS — E1165 Type 2 diabetes mellitus with hyperglycemia: Secondary | ICD-10-CM | POA: Diagnosis not present

## 2021-04-18 DIAGNOSIS — E1122 Type 2 diabetes mellitus with diabetic chronic kidney disease: Secondary | ICD-10-CM | POA: Diagnosis not present

## 2021-04-18 DIAGNOSIS — M549 Dorsalgia, unspecified: Secondary | ICD-10-CM | POA: Diagnosis not present

## 2021-04-18 DIAGNOSIS — N179 Acute kidney failure, unspecified: Secondary | ICD-10-CM | POA: Diagnosis not present

## 2021-04-18 DIAGNOSIS — N1832 Chronic kidney disease, stage 3b: Secondary | ICD-10-CM | POA: Diagnosis not present

## 2021-04-18 DIAGNOSIS — E785 Hyperlipidemia, unspecified: Secondary | ICD-10-CM | POA: Diagnosis not present

## 2021-04-18 DIAGNOSIS — N184 Chronic kidney disease, stage 4 (severe): Secondary | ICD-10-CM | POA: Diagnosis not present

## 2021-04-18 DIAGNOSIS — I129 Hypertensive chronic kidney disease with stage 1 through stage 4 chronic kidney disease, or unspecified chronic kidney disease: Secondary | ICD-10-CM | POA: Diagnosis not present

## 2021-04-19 ENCOUNTER — Other Ambulatory Visit: Payer: Self-pay | Admitting: Internal Medicine

## 2021-04-19 DIAGNOSIS — N1832 Chronic kidney disease, stage 3b: Secondary | ICD-10-CM

## 2021-04-19 DIAGNOSIS — N179 Acute kidney failure, unspecified: Secondary | ICD-10-CM

## 2021-04-30 ENCOUNTER — Ambulatory Visit
Admission: RE | Admit: 2021-04-30 | Discharge: 2021-04-30 | Disposition: A | Discharge status: Home / Self Care | Payer: Medicare Other | Source: Ambulatory Visit | Attending: Internal Medicine | Admitting: Internal Medicine

## 2021-04-30 DIAGNOSIS — N1832 Chronic kidney disease, stage 3b: Secondary | ICD-10-CM

## 2021-04-30 DIAGNOSIS — N179 Acute kidney failure, unspecified: Secondary | ICD-10-CM

## 2021-04-30 DIAGNOSIS — N189 Chronic kidney disease, unspecified: Secondary | ICD-10-CM | POA: Diagnosis not present

## 2021-05-17 DIAGNOSIS — E1165 Type 2 diabetes mellitus with hyperglycemia: Secondary | ICD-10-CM | POA: Diagnosis not present

## 2021-05-31 DIAGNOSIS — Z961 Presence of intraocular lens: Secondary | ICD-10-CM | POA: Diagnosis not present

## 2021-05-31 DIAGNOSIS — H26492 Other secondary cataract, left eye: Secondary | ICD-10-CM | POA: Diagnosis not present

## 2021-05-31 DIAGNOSIS — E113312 Type 2 diabetes mellitus with moderate nonproliferative diabetic retinopathy with macular edema, left eye: Secondary | ICD-10-CM | POA: Diagnosis not present

## 2021-05-31 DIAGNOSIS — H2511 Age-related nuclear cataract, right eye: Secondary | ICD-10-CM | POA: Diagnosis not present

## 2021-06-11 DIAGNOSIS — N1832 Chronic kidney disease, stage 3b: Secondary | ICD-10-CM | POA: Diagnosis not present

## 2021-06-16 DIAGNOSIS — E1165 Type 2 diabetes mellitus with hyperglycemia: Secondary | ICD-10-CM | POA: Diagnosis not present

## 2021-06-18 DIAGNOSIS — I129 Hypertensive chronic kidney disease with stage 1 through stage 4 chronic kidney disease, or unspecified chronic kidney disease: Secondary | ICD-10-CM | POA: Diagnosis not present

## 2021-06-18 DIAGNOSIS — N1832 Chronic kidney disease, stage 3b: Secondary | ICD-10-CM | POA: Diagnosis not present

## 2021-06-18 DIAGNOSIS — E785 Hyperlipidemia, unspecified: Secondary | ICD-10-CM | POA: Diagnosis not present

## 2021-06-18 DIAGNOSIS — E872 Acidosis, unspecified: Secondary | ICD-10-CM | POA: Diagnosis not present

## 2021-06-18 DIAGNOSIS — E1122 Type 2 diabetes mellitus with diabetic chronic kidney disease: Secondary | ICD-10-CM | POA: Diagnosis not present

## 2021-06-19 ENCOUNTER — Encounter: Payer: Self-pay | Admitting: Gastroenterology

## 2021-07-17 DIAGNOSIS — E1165 Type 2 diabetes mellitus with hyperglycemia: Secondary | ICD-10-CM | POA: Diagnosis not present

## 2021-08-16 DIAGNOSIS — E1165 Type 2 diabetes mellitus with hyperglycemia: Secondary | ICD-10-CM | POA: Diagnosis not present

## 2021-08-21 DIAGNOSIS — E1140 Type 2 diabetes mellitus with diabetic neuropathy, unspecified: Secondary | ICD-10-CM | POA: Diagnosis not present

## 2021-08-21 DIAGNOSIS — N184 Chronic kidney disease, stage 4 (severe): Secondary | ICD-10-CM | POA: Diagnosis not present

## 2021-08-21 DIAGNOSIS — I10 Essential (primary) hypertension: Secondary | ICD-10-CM | POA: Diagnosis not present

## 2021-08-21 DIAGNOSIS — Z1331 Encounter for screening for depression: Secondary | ICD-10-CM | POA: Diagnosis not present

## 2021-08-21 DIAGNOSIS — Z0000 Encounter for general adult medical examination without abnormal findings: Secondary | ICD-10-CM | POA: Diagnosis not present

## 2021-08-21 DIAGNOSIS — Z6832 Body mass index (BMI) 32.0-32.9, adult: Secondary | ICD-10-CM | POA: Diagnosis not present

## 2021-09-06 DIAGNOSIS — H26492 Other secondary cataract, left eye: Secondary | ICD-10-CM | POA: Diagnosis not present

## 2021-09-06 DIAGNOSIS — H2511 Age-related nuclear cataract, right eye: Secondary | ICD-10-CM | POA: Diagnosis not present

## 2021-09-06 DIAGNOSIS — E113312 Type 2 diabetes mellitus with moderate nonproliferative diabetic retinopathy with macular edema, left eye: Secondary | ICD-10-CM | POA: Diagnosis not present

## 2021-09-06 DIAGNOSIS — E113311 Type 2 diabetes mellitus with moderate nonproliferative diabetic retinopathy with macular edema, right eye: Secondary | ICD-10-CM | POA: Diagnosis not present

## 2021-09-16 DIAGNOSIS — E1165 Type 2 diabetes mellitus with hyperglycemia: Secondary | ICD-10-CM | POA: Diagnosis not present

## 2021-09-20 DIAGNOSIS — E113312 Type 2 diabetes mellitus with moderate nonproliferative diabetic retinopathy with macular edema, left eye: Secondary | ICD-10-CM | POA: Diagnosis not present

## 2021-09-20 DIAGNOSIS — E113313 Type 2 diabetes mellitus with moderate nonproliferative diabetic retinopathy with macular edema, bilateral: Secondary | ICD-10-CM | POA: Diagnosis not present

## 2021-10-15 DIAGNOSIS — N1832 Chronic kidney disease, stage 3b: Secondary | ICD-10-CM | POA: Diagnosis not present

## 2021-10-17 DIAGNOSIS — E1165 Type 2 diabetes mellitus with hyperglycemia: Secondary | ICD-10-CM | POA: Diagnosis not present

## 2021-10-23 DIAGNOSIS — E785 Hyperlipidemia, unspecified: Secondary | ICD-10-CM | POA: Diagnosis not present

## 2021-10-23 DIAGNOSIS — N1832 Chronic kidney disease, stage 3b: Secondary | ICD-10-CM | POA: Diagnosis not present

## 2021-10-23 DIAGNOSIS — I129 Hypertensive chronic kidney disease with stage 1 through stage 4 chronic kidney disease, or unspecified chronic kidney disease: Secondary | ICD-10-CM | POA: Diagnosis not present

## 2021-10-23 DIAGNOSIS — E872 Acidosis, unspecified: Secondary | ICD-10-CM | POA: Diagnosis not present

## 2021-10-23 DIAGNOSIS — E1122 Type 2 diabetes mellitus with diabetic chronic kidney disease: Secondary | ICD-10-CM | POA: Diagnosis not present

## 2021-11-01 DIAGNOSIS — E113312 Type 2 diabetes mellitus with moderate nonproliferative diabetic retinopathy with macular edema, left eye: Secondary | ICD-10-CM | POA: Diagnosis not present

## 2021-11-01 DIAGNOSIS — H26492 Other secondary cataract, left eye: Secondary | ICD-10-CM | POA: Diagnosis not present

## 2021-11-14 DIAGNOSIS — E1165 Type 2 diabetes mellitus with hyperglycemia: Secondary | ICD-10-CM | POA: Diagnosis not present

## 2021-11-29 DIAGNOSIS — E113312 Type 2 diabetes mellitus with moderate nonproliferative diabetic retinopathy with macular edema, left eye: Secondary | ICD-10-CM | POA: Diagnosis not present

## 2021-12-15 DIAGNOSIS — E1165 Type 2 diabetes mellitus with hyperglycemia: Secondary | ICD-10-CM | POA: Diagnosis not present

## 2021-12-27 DIAGNOSIS — E1129 Type 2 diabetes mellitus with other diabetic kidney complication: Secondary | ICD-10-CM | POA: Diagnosis not present

## 2021-12-27 DIAGNOSIS — E113312 Type 2 diabetes mellitus with moderate nonproliferative diabetic retinopathy with macular edema, left eye: Secondary | ICD-10-CM | POA: Diagnosis not present

## 2022-01-06 DIAGNOSIS — E1129 Type 2 diabetes mellitus with other diabetic kidney complication: Secondary | ICD-10-CM | POA: Diagnosis not present

## 2022-01-06 DIAGNOSIS — I10 Essential (primary) hypertension: Secondary | ICD-10-CM | POA: Diagnosis not present

## 2022-01-14 DIAGNOSIS — E1165 Type 2 diabetes mellitus with hyperglycemia: Secondary | ICD-10-CM | POA: Diagnosis not present

## 2022-01-24 DIAGNOSIS — E113312 Type 2 diabetes mellitus with moderate nonproliferative diabetic retinopathy with macular edema, left eye: Secondary | ICD-10-CM | POA: Diagnosis not present

## 2022-02-06 DIAGNOSIS — I10 Essential (primary) hypertension: Secondary | ICD-10-CM | POA: Diagnosis not present

## 2022-02-06 DIAGNOSIS — E1129 Type 2 diabetes mellitus with other diabetic kidney complication: Secondary | ICD-10-CM | POA: Diagnosis not present

## 2022-02-14 DIAGNOSIS — E1165 Type 2 diabetes mellitus with hyperglycemia: Secondary | ICD-10-CM | POA: Diagnosis not present

## 2022-02-21 DIAGNOSIS — E113312 Type 2 diabetes mellitus with moderate nonproliferative diabetic retinopathy with macular edema, left eye: Secondary | ICD-10-CM | POA: Diagnosis not present

## 2022-03-08 DIAGNOSIS — E1129 Type 2 diabetes mellitus with other diabetic kidney complication: Secondary | ICD-10-CM | POA: Diagnosis not present

## 2022-03-08 DIAGNOSIS — I10 Essential (primary) hypertension: Secondary | ICD-10-CM | POA: Diagnosis not present

## 2022-03-16 DIAGNOSIS — E1165 Type 2 diabetes mellitus with hyperglycemia: Secondary | ICD-10-CM | POA: Diagnosis not present

## 2022-03-21 DIAGNOSIS — E113312 Type 2 diabetes mellitus with moderate nonproliferative diabetic retinopathy with macular edema, left eye: Secondary | ICD-10-CM | POA: Diagnosis not present

## 2022-04-08 DIAGNOSIS — N1832 Chronic kidney disease, stage 3b: Secondary | ICD-10-CM | POA: Diagnosis not present

## 2022-04-16 DIAGNOSIS — I10 Essential (primary) hypertension: Secondary | ICD-10-CM | POA: Diagnosis not present

## 2022-04-16 DIAGNOSIS — E1165 Type 2 diabetes mellitus with hyperglycemia: Secondary | ICD-10-CM | POA: Diagnosis not present

## 2022-04-16 DIAGNOSIS — E1140 Type 2 diabetes mellitus with diabetic neuropathy, unspecified: Secondary | ICD-10-CM | POA: Diagnosis not present

## 2022-04-16 DIAGNOSIS — Z6832 Body mass index (BMI) 32.0-32.9, adult: Secondary | ICD-10-CM | POA: Diagnosis not present

## 2022-04-16 DIAGNOSIS — N184 Chronic kidney disease, stage 4 (severe): Secondary | ICD-10-CM | POA: Diagnosis not present

## 2022-04-16 DIAGNOSIS — E1129 Type 2 diabetes mellitus with other diabetic kidney complication: Secondary | ICD-10-CM | POA: Diagnosis not present

## 2022-04-18 DIAGNOSIS — E113311 Type 2 diabetes mellitus with moderate nonproliferative diabetic retinopathy with macular edema, right eye: Secondary | ICD-10-CM | POA: Diagnosis not present

## 2022-04-18 DIAGNOSIS — I129 Hypertensive chronic kidney disease with stage 1 through stage 4 chronic kidney disease, or unspecified chronic kidney disease: Secondary | ICD-10-CM | POA: Diagnosis not present

## 2022-04-18 DIAGNOSIS — E1122 Type 2 diabetes mellitus with diabetic chronic kidney disease: Secondary | ICD-10-CM | POA: Diagnosis not present

## 2022-04-18 DIAGNOSIS — H2511 Age-related nuclear cataract, right eye: Secondary | ICD-10-CM | POA: Diagnosis not present

## 2022-04-18 DIAGNOSIS — E785 Hyperlipidemia, unspecified: Secondary | ICD-10-CM | POA: Diagnosis not present

## 2022-04-18 DIAGNOSIS — N1832 Chronic kidney disease, stage 3b: Secondary | ICD-10-CM | POA: Diagnosis not present

## 2022-04-18 DIAGNOSIS — E113312 Type 2 diabetes mellitus with moderate nonproliferative diabetic retinopathy with macular edema, left eye: Secondary | ICD-10-CM | POA: Diagnosis not present

## 2022-04-19 DIAGNOSIS — Z01818 Encounter for other preprocedural examination: Secondary | ICD-10-CM | POA: Diagnosis not present

## 2022-04-19 DIAGNOSIS — H2511 Age-related nuclear cataract, right eye: Secondary | ICD-10-CM | POA: Diagnosis not present

## 2022-05-06 DIAGNOSIS — H2511 Age-related nuclear cataract, right eye: Secondary | ICD-10-CM | POA: Diagnosis not present

## 2022-05-06 DIAGNOSIS — H269 Unspecified cataract: Secondary | ICD-10-CM | POA: Diagnosis not present

## 2022-05-09 DIAGNOSIS — I10 Essential (primary) hypertension: Secondary | ICD-10-CM | POA: Diagnosis not present

## 2022-05-09 DIAGNOSIS — E1129 Type 2 diabetes mellitus with other diabetic kidney complication: Secondary | ICD-10-CM | POA: Diagnosis not present

## 2022-05-17 DIAGNOSIS — E1165 Type 2 diabetes mellitus with hyperglycemia: Secondary | ICD-10-CM | POA: Diagnosis not present

## 2022-05-30 DIAGNOSIS — E113311 Type 2 diabetes mellitus with moderate nonproliferative diabetic retinopathy with macular edema, right eye: Secondary | ICD-10-CM | POA: Diagnosis not present

## 2022-05-30 DIAGNOSIS — E113312 Type 2 diabetes mellitus with moderate nonproliferative diabetic retinopathy with macular edema, left eye: Secondary | ICD-10-CM | POA: Diagnosis not present

## 2022-06-16 DIAGNOSIS — E1165 Type 2 diabetes mellitus with hyperglycemia: Secondary | ICD-10-CM | POA: Diagnosis not present

## 2022-06-25 DIAGNOSIS — I10 Essential (primary) hypertension: Secondary | ICD-10-CM | POA: Diagnosis not present

## 2022-06-25 DIAGNOSIS — E1142 Type 2 diabetes mellitus with diabetic polyneuropathy: Secondary | ICD-10-CM | POA: Diagnosis not present

## 2022-06-25 DIAGNOSIS — N184 Chronic kidney disease, stage 4 (severe): Secondary | ICD-10-CM | POA: Diagnosis not present

## 2022-06-25 DIAGNOSIS — E1169 Type 2 diabetes mellitus with other specified complication: Secondary | ICD-10-CM | POA: Diagnosis not present

## 2022-06-25 DIAGNOSIS — E669 Obesity, unspecified: Secondary | ICD-10-CM | POA: Diagnosis not present

## 2022-06-25 DIAGNOSIS — E785 Hyperlipidemia, unspecified: Secondary | ICD-10-CM | POA: Diagnosis not present

## 2022-06-25 DIAGNOSIS — E1122 Type 2 diabetes mellitus with diabetic chronic kidney disease: Secondary | ICD-10-CM | POA: Diagnosis not present

## 2022-06-25 DIAGNOSIS — I129 Hypertensive chronic kidney disease with stage 1 through stage 4 chronic kidney disease, or unspecified chronic kidney disease: Secondary | ICD-10-CM | POA: Diagnosis not present

## 2022-06-25 DIAGNOSIS — E1165 Type 2 diabetes mellitus with hyperglycemia: Secondary | ICD-10-CM | POA: Diagnosis not present

## 2022-06-25 DIAGNOSIS — E11311 Type 2 diabetes mellitus with unspecified diabetic retinopathy with macular edema: Secondary | ICD-10-CM | POA: Diagnosis not present

## 2022-06-25 DIAGNOSIS — G8929 Other chronic pain: Secondary | ICD-10-CM | POA: Diagnosis not present

## 2022-06-25 DIAGNOSIS — Z794 Long term (current) use of insulin: Secondary | ICD-10-CM | POA: Diagnosis not present

## 2022-06-27 DIAGNOSIS — E113312 Type 2 diabetes mellitus with moderate nonproliferative diabetic retinopathy with macular edema, left eye: Secondary | ICD-10-CM | POA: Diagnosis not present

## 2022-06-27 DIAGNOSIS — E113311 Type 2 diabetes mellitus with moderate nonproliferative diabetic retinopathy with macular edema, right eye: Secondary | ICD-10-CM | POA: Diagnosis not present

## 2022-07-09 DIAGNOSIS — E1129 Type 2 diabetes mellitus with other diabetic kidney complication: Secondary | ICD-10-CM | POA: Diagnosis not present

## 2022-07-09 DIAGNOSIS — I10 Essential (primary) hypertension: Secondary | ICD-10-CM | POA: Diagnosis not present

## 2022-07-17 DIAGNOSIS — E1165 Type 2 diabetes mellitus with hyperglycemia: Secondary | ICD-10-CM | POA: Diagnosis not present

## 2022-08-08 DIAGNOSIS — E113312 Type 2 diabetes mellitus with moderate nonproliferative diabetic retinopathy with macular edema, left eye: Secondary | ICD-10-CM | POA: Diagnosis not present

## 2022-08-08 DIAGNOSIS — E113311 Type 2 diabetes mellitus with moderate nonproliferative diabetic retinopathy with macular edema, right eye: Secondary | ICD-10-CM | POA: Diagnosis not present

## 2022-08-16 DIAGNOSIS — E1165 Type 2 diabetes mellitus with hyperglycemia: Secondary | ICD-10-CM | POA: Diagnosis not present

## 2022-09-05 DIAGNOSIS — E113312 Type 2 diabetes mellitus with moderate nonproliferative diabetic retinopathy with macular edema, left eye: Secondary | ICD-10-CM | POA: Diagnosis not present

## 2022-09-05 DIAGNOSIS — E113311 Type 2 diabetes mellitus with moderate nonproliferative diabetic retinopathy with macular edema, right eye: Secondary | ICD-10-CM | POA: Diagnosis not present

## 2022-09-16 DIAGNOSIS — E1165 Type 2 diabetes mellitus with hyperglycemia: Secondary | ICD-10-CM | POA: Diagnosis not present

## 2022-10-03 DIAGNOSIS — E113312 Type 2 diabetes mellitus with moderate nonproliferative diabetic retinopathy with macular edema, left eye: Secondary | ICD-10-CM | POA: Diagnosis not present

## 2022-10-07 DIAGNOSIS — N1832 Chronic kidney disease, stage 3b: Secondary | ICD-10-CM | POA: Diagnosis not present

## 2022-10-17 DIAGNOSIS — E1165 Type 2 diabetes mellitus with hyperglycemia: Secondary | ICD-10-CM | POA: Diagnosis not present

## 2022-10-17 DIAGNOSIS — N1832 Chronic kidney disease, stage 3b: Secondary | ICD-10-CM | POA: Diagnosis not present

## 2022-10-17 DIAGNOSIS — I129 Hypertensive chronic kidney disease with stage 1 through stage 4 chronic kidney disease, or unspecified chronic kidney disease: Secondary | ICD-10-CM | POA: Diagnosis not present

## 2022-10-17 DIAGNOSIS — E1122 Type 2 diabetes mellitus with diabetic chronic kidney disease: Secondary | ICD-10-CM | POA: Diagnosis not present

## 2022-10-17 DIAGNOSIS — E785 Hyperlipidemia, unspecified: Secondary | ICD-10-CM | POA: Diagnosis not present

## 2022-10-21 DIAGNOSIS — Z6833 Body mass index (BMI) 33.0-33.9, adult: Secondary | ICD-10-CM | POA: Diagnosis not present

## 2022-10-21 DIAGNOSIS — E1140 Type 2 diabetes mellitus with diabetic neuropathy, unspecified: Secondary | ICD-10-CM | POA: Diagnosis not present

## 2022-10-21 DIAGNOSIS — Z9181 History of falling: Secondary | ICD-10-CM | POA: Diagnosis not present

## 2022-10-21 DIAGNOSIS — Z1331 Encounter for screening for depression: Secondary | ICD-10-CM | POA: Diagnosis not present

## 2022-10-21 DIAGNOSIS — E785 Hyperlipidemia, unspecified: Secondary | ICD-10-CM | POA: Diagnosis not present

## 2022-10-21 DIAGNOSIS — E1129 Type 2 diabetes mellitus with other diabetic kidney complication: Secondary | ICD-10-CM | POA: Diagnosis not present

## 2022-10-21 DIAGNOSIS — I10 Essential (primary) hypertension: Secondary | ICD-10-CM | POA: Diagnosis not present

## 2022-10-21 DIAGNOSIS — Z0000 Encounter for general adult medical examination without abnormal findings: Secondary | ICD-10-CM | POA: Diagnosis not present

## 2022-10-24 DIAGNOSIS — E1165 Type 2 diabetes mellitus with hyperglycemia: Secondary | ICD-10-CM | POA: Diagnosis not present

## 2022-10-24 DIAGNOSIS — E1140 Type 2 diabetes mellitus with diabetic neuropathy, unspecified: Secondary | ICD-10-CM | POA: Diagnosis not present

## 2022-10-24 DIAGNOSIS — E785 Hyperlipidemia, unspecified: Secondary | ICD-10-CM | POA: Diagnosis not present

## 2022-10-24 DIAGNOSIS — H3500 Unspecified background retinopathy: Secondary | ICD-10-CM | POA: Diagnosis not present

## 2022-10-24 DIAGNOSIS — N1832 Chronic kidney disease, stage 3b: Secondary | ICD-10-CM | POA: Diagnosis not present

## 2022-10-24 DIAGNOSIS — I10 Essential (primary) hypertension: Secondary | ICD-10-CM | POA: Diagnosis not present

## 2022-10-31 DIAGNOSIS — E113312 Type 2 diabetes mellitus with moderate nonproliferative diabetic retinopathy with macular edema, left eye: Secondary | ICD-10-CM | POA: Diagnosis not present

## 2022-11-15 DIAGNOSIS — E1165 Type 2 diabetes mellitus with hyperglycemia: Secondary | ICD-10-CM | POA: Diagnosis not present

## 2022-11-18 DIAGNOSIS — H5203 Hypermetropia, bilateral: Secondary | ICD-10-CM | POA: Diagnosis not present

## 2022-11-28 DIAGNOSIS — E113312 Type 2 diabetes mellitus with moderate nonproliferative diabetic retinopathy with macular edema, left eye: Secondary | ICD-10-CM | POA: Diagnosis not present

## 2022-12-16 DIAGNOSIS — E1165 Type 2 diabetes mellitus with hyperglycemia: Secondary | ICD-10-CM | POA: Diagnosis not present

## 2023-01-07 DIAGNOSIS — E113313 Type 2 diabetes mellitus with moderate nonproliferative diabetic retinopathy with macular edema, bilateral: Secondary | ICD-10-CM | POA: Diagnosis not present

## 2023-01-15 DIAGNOSIS — E1165 Type 2 diabetes mellitus with hyperglycemia: Secondary | ICD-10-CM | POA: Diagnosis not present

## 2023-02-13 DIAGNOSIS — K5909 Other constipation: Secondary | ICD-10-CM | POA: Diagnosis not present

## 2023-02-13 DIAGNOSIS — N1832 Chronic kidney disease, stage 3b: Secondary | ICD-10-CM | POA: Diagnosis not present

## 2023-02-13 DIAGNOSIS — E1129 Type 2 diabetes mellitus with other diabetic kidney complication: Secondary | ICD-10-CM | POA: Diagnosis not present

## 2023-02-13 DIAGNOSIS — J0120 Acute ethmoidal sinusitis, unspecified: Secondary | ICD-10-CM | POA: Diagnosis not present

## 2023-02-13 DIAGNOSIS — Z6832 Body mass index (BMI) 32.0-32.9, adult: Secondary | ICD-10-CM | POA: Diagnosis not present

## 2023-02-15 DIAGNOSIS — E1165 Type 2 diabetes mellitus with hyperglycemia: Secondary | ICD-10-CM | POA: Diagnosis not present

## 2023-02-20 DIAGNOSIS — E113312 Type 2 diabetes mellitus with moderate nonproliferative diabetic retinopathy with macular edema, left eye: Secondary | ICD-10-CM | POA: Diagnosis not present

## 2023-03-17 DIAGNOSIS — E1165 Type 2 diabetes mellitus with hyperglycemia: Secondary | ICD-10-CM | POA: Diagnosis not present

## 2023-04-03 DIAGNOSIS — E113312 Type 2 diabetes mellitus with moderate nonproliferative diabetic retinopathy with macular edema, left eye: Secondary | ICD-10-CM | POA: Diagnosis not present

## 2023-04-17 DIAGNOSIS — E1165 Type 2 diabetes mellitus with hyperglycemia: Secondary | ICD-10-CM | POA: Diagnosis not present

## 2023-04-22 DIAGNOSIS — E785 Hyperlipidemia, unspecified: Secondary | ICD-10-CM | POA: Diagnosis not present

## 2023-04-22 DIAGNOSIS — I129 Hypertensive chronic kidney disease with stage 1 through stage 4 chronic kidney disease, or unspecified chronic kidney disease: Secondary | ICD-10-CM | POA: Diagnosis not present

## 2023-04-22 DIAGNOSIS — N1832 Chronic kidney disease, stage 3b: Secondary | ICD-10-CM | POA: Diagnosis not present

## 2023-04-22 DIAGNOSIS — E1122 Type 2 diabetes mellitus with diabetic chronic kidney disease: Secondary | ICD-10-CM | POA: Diagnosis not present

## 2023-05-04 IMAGING — US US RENAL
1 series · 14 of 25 positions shown · non-contrast
Comparison: 06/20/2020 CT without contrast

CLINICAL DATA: Stage III B chronic kidney disease, acute kidney
injury

EXAM:
RENAL / URINARY TRACT ULTRASOUND COMPLETE

[Series 1: us renal · 0.22mm/px · 14 of 38 slices shown]
[im 1/38]
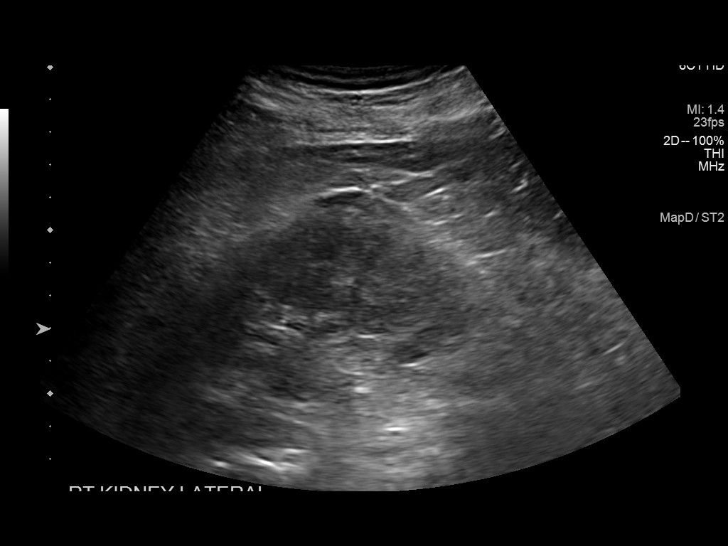
[im 4/38]
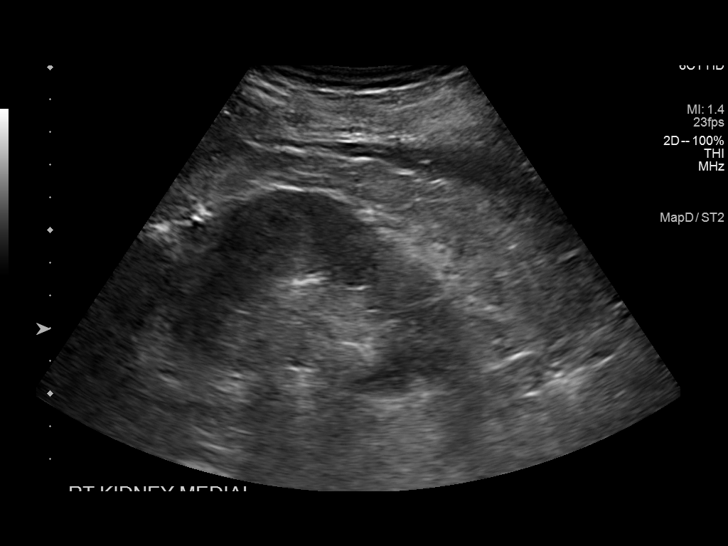
[im 7/38]
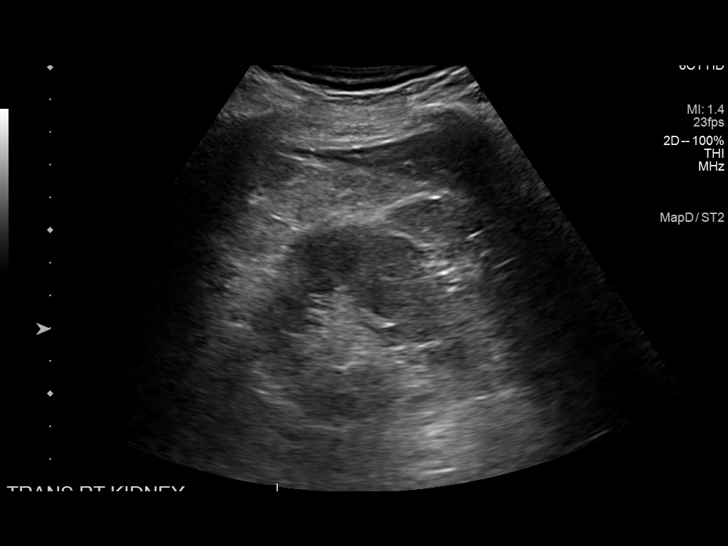
[im 10/38]
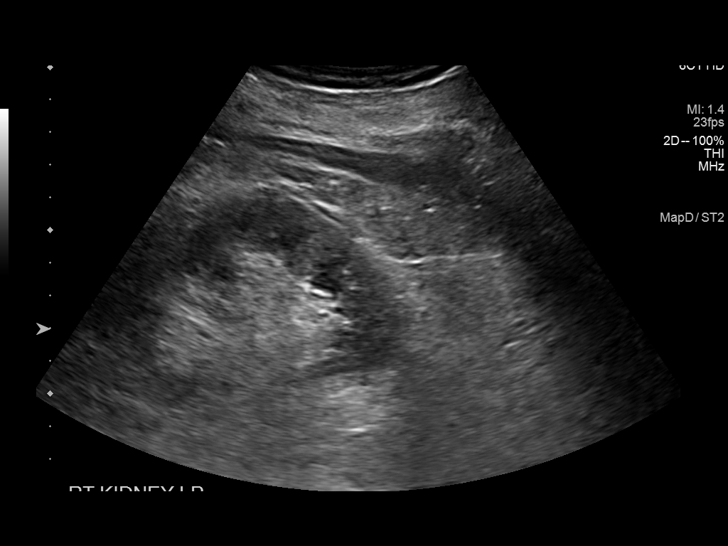
[im 13/38]
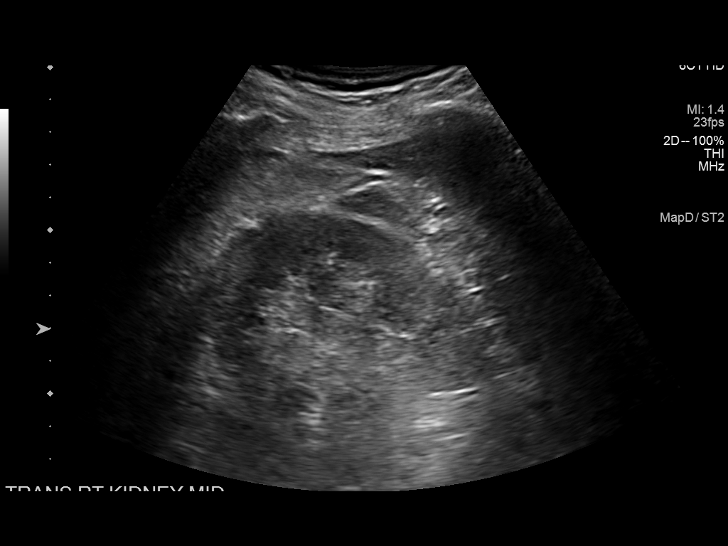
[im 14/38]
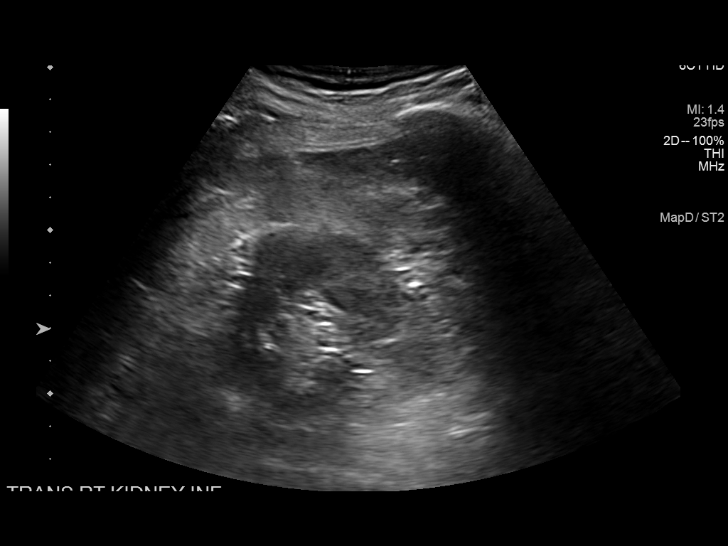
[im 17/38]
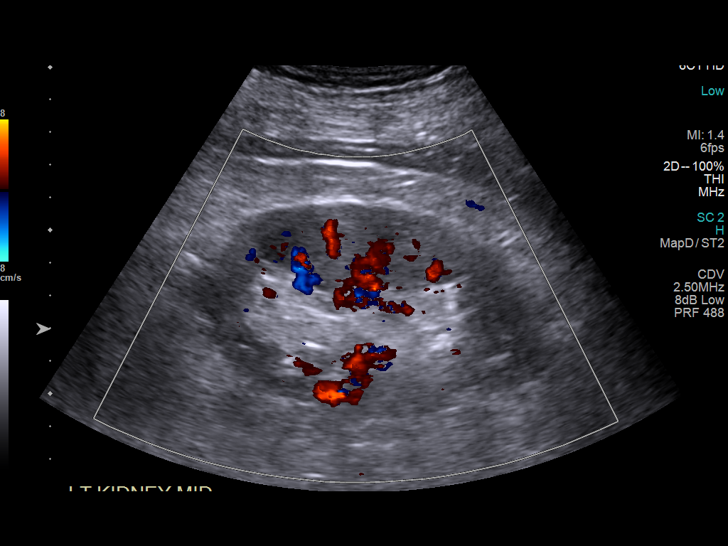
[im 21/38]
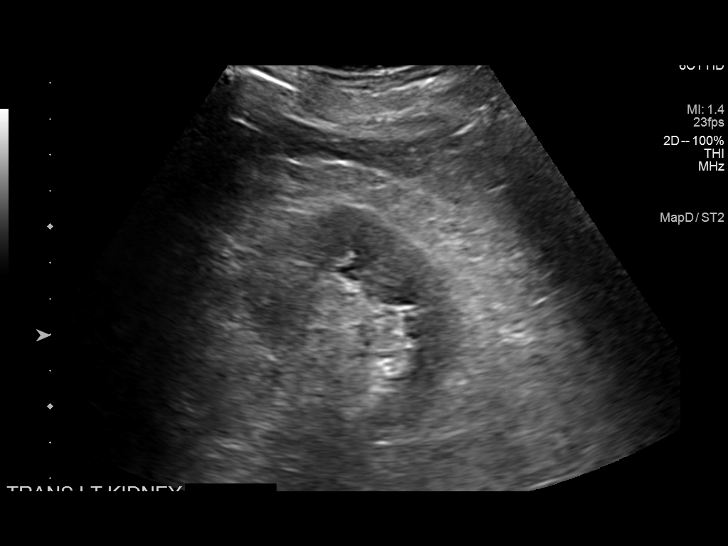
[im 24/38]
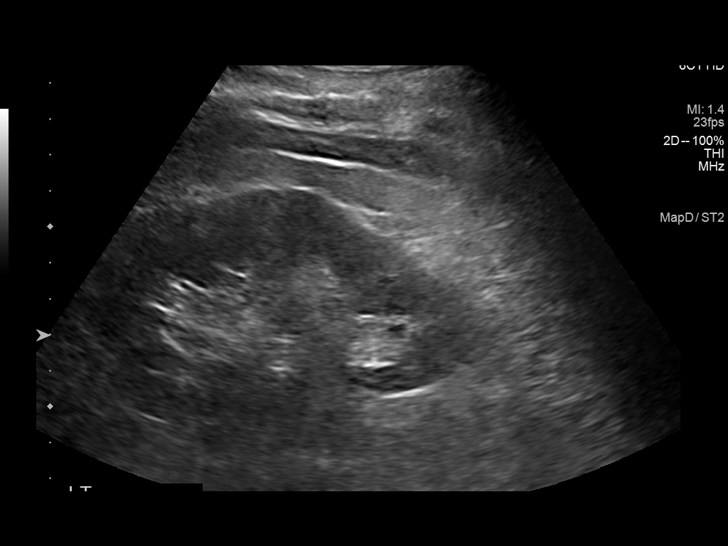
[im 25/38]
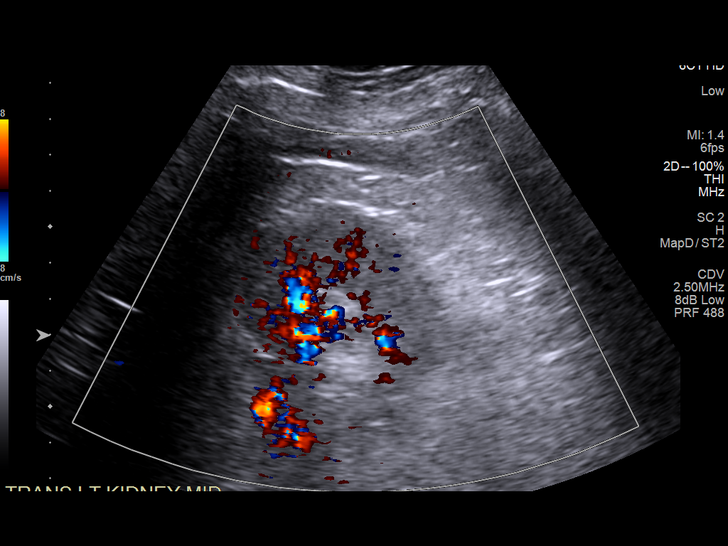
[im 28/38]
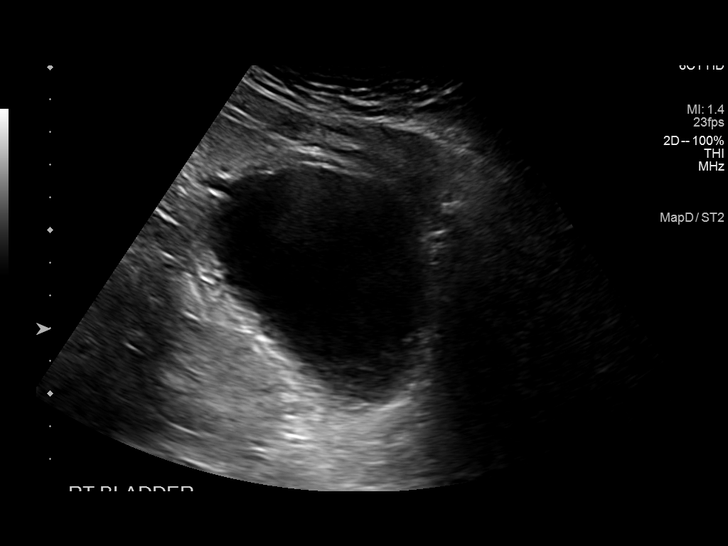
[im 31/38]
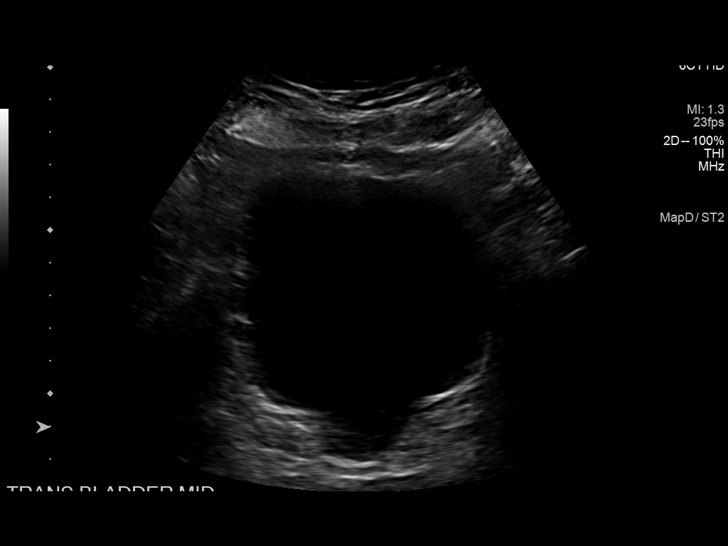
[im 34/38]
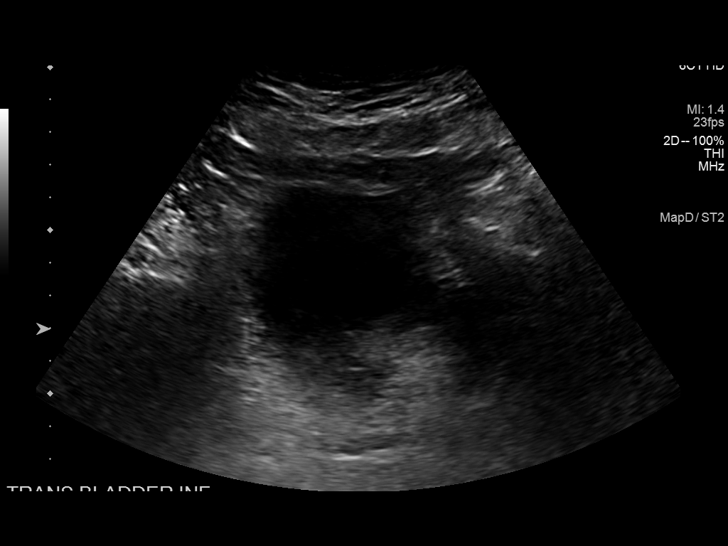
[im 38/38]
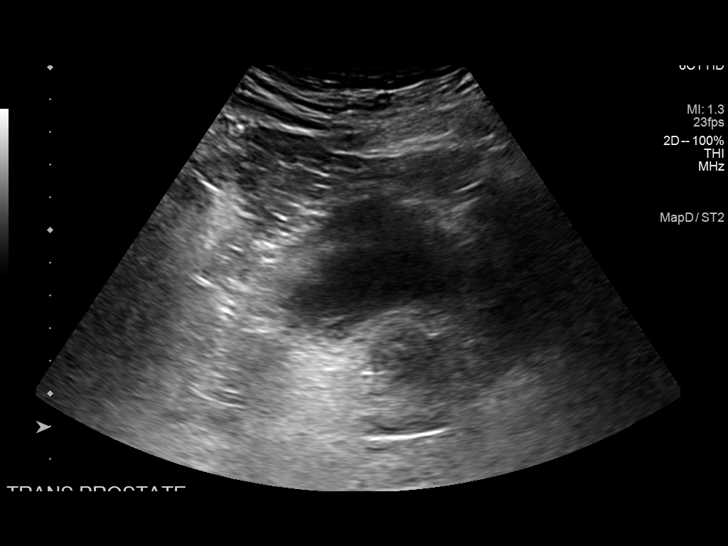

[14 of 25 positions shown; findings below may reference images not displayed]

FINDINGS: Right Kidney:

Renal measurements: 9.4 x 6.6 x 5.8 cm = volume: 189 mL. Slight
increased echogenicity but normal cortical thickness. No acute
finding or focal abnormality. No hydronephrosis.

Left Kidney:

Renal measurements: 10.6 x 6.5 x 5.3 cm = volume: 192 mL. Similar
slight increased echogenicity but normal cortical thickness. No
hydronephrosis or acute finding. No focal abnormality.

Bladder:

Appears normal for degree of bladder distention.

Other:

No free fluid
IMPRESSION: Slight increased renal echogenicity compatible with medical renal
disease. No acute finding or hydronephrosis.

## 2023-05-15 DIAGNOSIS — E113312 Type 2 diabetes mellitus with moderate nonproliferative diabetic retinopathy with macular edema, left eye: Secondary | ICD-10-CM | POA: Diagnosis not present

## 2023-05-18 DIAGNOSIS — E1165 Type 2 diabetes mellitus with hyperglycemia: Secondary | ICD-10-CM | POA: Diagnosis not present

## 2023-06-17 DIAGNOSIS — E1165 Type 2 diabetes mellitus with hyperglycemia: Secondary | ICD-10-CM | POA: Diagnosis not present

## 2023-06-26 DIAGNOSIS — E113312 Type 2 diabetes mellitus with moderate nonproliferative diabetic retinopathy with macular edema, left eye: Secondary | ICD-10-CM | POA: Diagnosis not present

## 2023-07-18 DIAGNOSIS — E1165 Type 2 diabetes mellitus with hyperglycemia: Secondary | ICD-10-CM | POA: Diagnosis not present

## 2023-08-05 DIAGNOSIS — M216X1 Other acquired deformities of right foot: Secondary | ICD-10-CM | POA: Diagnosis not present

## 2023-08-05 DIAGNOSIS — M778 Other enthesopathies, not elsewhere classified: Secondary | ICD-10-CM | POA: Diagnosis not present

## 2023-08-17 DIAGNOSIS — E1165 Type 2 diabetes mellitus with hyperglycemia: Secondary | ICD-10-CM | POA: Diagnosis not present

## 2023-08-18 DIAGNOSIS — E1140 Type 2 diabetes mellitus with diabetic neuropathy, unspecified: Secondary | ICD-10-CM | POA: Diagnosis not present

## 2023-08-19 DIAGNOSIS — I10 Essential (primary) hypertension: Secondary | ICD-10-CM | POA: Diagnosis not present

## 2023-08-19 DIAGNOSIS — E1129 Type 2 diabetes mellitus with other diabetic kidney complication: Secondary | ICD-10-CM | POA: Diagnosis not present

## 2023-08-19 DIAGNOSIS — Z6834 Body mass index (BMI) 34.0-34.9, adult: Secondary | ICD-10-CM | POA: Diagnosis not present

## 2023-08-19 DIAGNOSIS — E785 Hyperlipidemia, unspecified: Secondary | ICD-10-CM | POA: Diagnosis not present

## 2023-08-21 DIAGNOSIS — E113312 Type 2 diabetes mellitus with moderate nonproliferative diabetic retinopathy with macular edema, left eye: Secondary | ICD-10-CM | POA: Diagnosis not present

## 2023-09-16 DIAGNOSIS — M778 Other enthesopathies, not elsewhere classified: Secondary | ICD-10-CM | POA: Diagnosis not present

## 2023-09-16 DIAGNOSIS — E1140 Type 2 diabetes mellitus with diabetic neuropathy, unspecified: Secondary | ICD-10-CM | POA: Diagnosis not present

## 2023-09-16 DIAGNOSIS — Z794 Long term (current) use of insulin: Secondary | ICD-10-CM | POA: Diagnosis not present

## 2023-09-16 DIAGNOSIS — M19071 Primary osteoarthritis, right ankle and foot: Secondary | ICD-10-CM | POA: Diagnosis not present

## 2023-09-17 DIAGNOSIS — E1165 Type 2 diabetes mellitus with hyperglycemia: Secondary | ICD-10-CM | POA: Diagnosis not present

## 2023-10-13 DIAGNOSIS — N1832 Chronic kidney disease, stage 3b: Secondary | ICD-10-CM | POA: Diagnosis not present

## 2023-10-18 DIAGNOSIS — E1165 Type 2 diabetes mellitus with hyperglycemia: Secondary | ICD-10-CM | POA: Diagnosis not present

## 2023-10-22 DIAGNOSIS — I129 Hypertensive chronic kidney disease with stage 1 through stage 4 chronic kidney disease, or unspecified chronic kidney disease: Secondary | ICD-10-CM | POA: Diagnosis not present

## 2023-10-22 DIAGNOSIS — N1832 Chronic kidney disease, stage 3b: Secondary | ICD-10-CM | POA: Diagnosis not present

## 2023-10-22 DIAGNOSIS — E785 Hyperlipidemia, unspecified: Secondary | ICD-10-CM | POA: Diagnosis not present

## 2023-10-22 DIAGNOSIS — E1122 Type 2 diabetes mellitus with diabetic chronic kidney disease: Secondary | ICD-10-CM | POA: Diagnosis not present

## 2023-10-29 ENCOUNTER — Telehealth: Payer: Self-pay

## 2023-10-29 NOTE — Progress Notes (Signed)
   10/29/2023  Patient ID: Grant Patterson, male   DOB: [birth year], 73 y.o.   MRN: [id number]  Attempted to contact patient for scheduled appointment for medication management. Left HIPAA compliant message for patient to return my call at their convenience.    Harlon Flor, PharmD Clinical Pharmacist  [phone]

## 2023-10-30 DIAGNOSIS — E113311 Type 2 diabetes mellitus with moderate nonproliferative diabetic retinopathy with macular edema, right eye: Secondary | ICD-10-CM | POA: Diagnosis not present

## 2023-11-15 DIAGNOSIS — E1165 Type 2 diabetes mellitus with hyperglycemia: Secondary | ICD-10-CM | POA: Diagnosis not present

## 2023-11-27 DIAGNOSIS — E113311 Type 2 diabetes mellitus with moderate nonproliferative diabetic retinopathy with macular edema, right eye: Secondary | ICD-10-CM | POA: Diagnosis not present

## 2023-11-30 DIAGNOSIS — L97519 Non-pressure chronic ulcer of other part of right foot with unspecified severity: Secondary | ICD-10-CM | POA: Diagnosis not present

## 2023-11-30 DIAGNOSIS — E11621 Type 2 diabetes mellitus with foot ulcer: Secondary | ICD-10-CM | POA: Diagnosis not present

## 2023-12-01 DIAGNOSIS — Z6834 Body mass index (BMI) 34.0-34.9, adult: Secondary | ICD-10-CM | POA: Diagnosis not present

## 2023-12-01 DIAGNOSIS — E11621 Type 2 diabetes mellitus with foot ulcer: Secondary | ICD-10-CM | POA: Diagnosis not present

## 2023-12-01 DIAGNOSIS — Z1331 Encounter for screening for depression: Secondary | ICD-10-CM | POA: Diagnosis not present

## 2023-12-01 DIAGNOSIS — Z1339 Encounter for screening examination for other mental health and behavioral disorders: Secondary | ICD-10-CM | POA: Diagnosis not present

## 2023-12-01 DIAGNOSIS — L97509 Non-pressure chronic ulcer of other part of unspecified foot with unspecified severity: Secondary | ICD-10-CM | POA: Diagnosis not present

## 2023-12-01 DIAGNOSIS — Z0000 Encounter for general adult medical examination without abnormal findings: Secondary | ICD-10-CM | POA: Diagnosis not present

## 2023-12-01 DIAGNOSIS — Z9181 History of falling: Secondary | ICD-10-CM | POA: Diagnosis not present

## 2023-12-04 DIAGNOSIS — L97512 Non-pressure chronic ulcer of other part of right foot with fat layer exposed: Secondary | ICD-10-CM | POA: Diagnosis not present

## 2023-12-04 DIAGNOSIS — Z794 Long term (current) use of insulin: Secondary | ICD-10-CM | POA: Diagnosis not present

## 2023-12-04 DIAGNOSIS — L03115 Cellulitis of right lower limb: Secondary | ICD-10-CM | POA: Diagnosis not present

## 2023-12-04 DIAGNOSIS — E11621 Type 2 diabetes mellitus with foot ulcer: Secondary | ICD-10-CM | POA: Diagnosis not present

## 2023-12-04 DIAGNOSIS — E1140 Type 2 diabetes mellitus with diabetic neuropathy, unspecified: Secondary | ICD-10-CM | POA: Diagnosis not present

## 2023-12-10 DIAGNOSIS — H26491 Other secondary cataract, right eye: Secondary | ICD-10-CM | POA: Diagnosis not present

## 2023-12-11 DIAGNOSIS — E11621 Type 2 diabetes mellitus with foot ulcer: Secondary | ICD-10-CM | POA: Diagnosis not present

## 2023-12-11 DIAGNOSIS — E1140 Type 2 diabetes mellitus with diabetic neuropathy, unspecified: Secondary | ICD-10-CM | POA: Diagnosis not present

## 2023-12-11 DIAGNOSIS — Z794 Long term (current) use of insulin: Secondary | ICD-10-CM | POA: Diagnosis not present

## 2023-12-11 DIAGNOSIS — L03115 Cellulitis of right lower limb: Secondary | ICD-10-CM | POA: Diagnosis not present

## 2023-12-11 DIAGNOSIS — R0989 Other specified symptoms and signs involving the circulatory and respiratory systems: Secondary | ICD-10-CM | POA: Diagnosis not present

## 2023-12-11 DIAGNOSIS — L97512 Non-pressure chronic ulcer of other part of right foot with fat layer exposed: Secondary | ICD-10-CM | POA: Diagnosis not present

## 2023-12-12 DIAGNOSIS — L97512 Non-pressure chronic ulcer of other part of right foot with fat layer exposed: Secondary | ICD-10-CM | POA: Diagnosis not present

## 2023-12-12 DIAGNOSIS — I739 Peripheral vascular disease, unspecified: Secondary | ICD-10-CM | POA: Diagnosis not present

## 2023-12-16 DIAGNOSIS — E1165 Type 2 diabetes mellitus with hyperglycemia: Secondary | ICD-10-CM | POA: Diagnosis not present

## 2023-12-16 DIAGNOSIS — E1140 Type 2 diabetes mellitus with diabetic neuropathy, unspecified: Secondary | ICD-10-CM | POA: Diagnosis not present

## 2023-12-16 DIAGNOSIS — Z794 Long term (current) use of insulin: Secondary | ICD-10-CM | POA: Diagnosis not present

## 2023-12-16 DIAGNOSIS — L97512 Non-pressure chronic ulcer of other part of right foot with fat layer exposed: Secondary | ICD-10-CM | POA: Diagnosis not present

## 2023-12-16 DIAGNOSIS — L03115 Cellulitis of right lower limb: Secondary | ICD-10-CM | POA: Diagnosis not present

## 2023-12-16 DIAGNOSIS — E11621 Type 2 diabetes mellitus with foot ulcer: Secondary | ICD-10-CM | POA: Diagnosis not present

## 2023-12-25 DIAGNOSIS — L97512 Non-pressure chronic ulcer of other part of right foot with fat layer exposed: Secondary | ICD-10-CM | POA: Diagnosis not present

## 2023-12-25 DIAGNOSIS — E11621 Type 2 diabetes mellitus with foot ulcer: Secondary | ICD-10-CM | POA: Diagnosis not present

## 2023-12-25 DIAGNOSIS — E1140 Type 2 diabetes mellitus with diabetic neuropathy, unspecified: Secondary | ICD-10-CM | POA: Diagnosis not present

## 2023-12-25 DIAGNOSIS — Z794 Long term (current) use of insulin: Secondary | ICD-10-CM | POA: Diagnosis not present

## 2023-12-25 DIAGNOSIS — L03115 Cellulitis of right lower limb: Secondary | ICD-10-CM | POA: Diagnosis not present

## 2024-01-01 DIAGNOSIS — M7989 Other specified soft tissue disorders: Secondary | ICD-10-CM | POA: Diagnosis not present

## 2024-01-01 DIAGNOSIS — L03115 Cellulitis of right lower limb: Secondary | ICD-10-CM | POA: Diagnosis not present

## 2024-01-01 DIAGNOSIS — E11621 Type 2 diabetes mellitus with foot ulcer: Secondary | ICD-10-CM | POA: Diagnosis not present

## 2024-01-01 DIAGNOSIS — Z794 Long term (current) use of insulin: Secondary | ICD-10-CM | POA: Diagnosis not present

## 2024-01-01 DIAGNOSIS — L97512 Non-pressure chronic ulcer of other part of right foot with fat layer exposed: Secondary | ICD-10-CM | POA: Diagnosis not present

## 2024-01-01 DIAGNOSIS — E1140 Type 2 diabetes mellitus with diabetic neuropathy, unspecified: Secondary | ICD-10-CM | POA: Diagnosis not present

## 2024-01-08 DIAGNOSIS — E11621 Type 2 diabetes mellitus with foot ulcer: Secondary | ICD-10-CM | POA: Diagnosis not present

## 2024-01-08 DIAGNOSIS — E11610 Type 2 diabetes mellitus with diabetic neuropathic arthropathy: Secondary | ICD-10-CM | POA: Diagnosis not present

## 2024-01-08 DIAGNOSIS — L97512 Non-pressure chronic ulcer of other part of right foot with fat layer exposed: Secondary | ICD-10-CM | POA: Diagnosis not present

## 2024-01-08 DIAGNOSIS — Z794 Long term (current) use of insulin: Secondary | ICD-10-CM | POA: Diagnosis not present

## 2024-01-08 DIAGNOSIS — E1140 Type 2 diabetes mellitus with diabetic neuropathy, unspecified: Secondary | ICD-10-CM | POA: Diagnosis not present

## 2024-01-15 DIAGNOSIS — E1165 Type 2 diabetes mellitus with hyperglycemia: Secondary | ICD-10-CM | POA: Diagnosis not present

## 2024-01-20 DIAGNOSIS — Z794 Long term (current) use of insulin: Secondary | ICD-10-CM | POA: Diagnosis not present

## 2024-01-20 DIAGNOSIS — E11621 Type 2 diabetes mellitus with foot ulcer: Secondary | ICD-10-CM | POA: Diagnosis not present

## 2024-01-20 DIAGNOSIS — L97512 Non-pressure chronic ulcer of other part of right foot with fat layer exposed: Secondary | ICD-10-CM | POA: Diagnosis not present

## 2024-01-20 DIAGNOSIS — E11610 Type 2 diabetes mellitus with diabetic neuropathic arthropathy: Secondary | ICD-10-CM | POA: Diagnosis not present

## 2024-01-20 DIAGNOSIS — E1140 Type 2 diabetes mellitus with diabetic neuropathy, unspecified: Secondary | ICD-10-CM | POA: Diagnosis not present

## 2024-01-27 DIAGNOSIS — Z7984 Long term (current) use of oral hypoglycemic drugs: Secondary | ICD-10-CM | POA: Diagnosis not present

## 2024-01-27 DIAGNOSIS — L97412 Non-pressure chronic ulcer of right heel and midfoot with fat layer exposed: Secondary | ICD-10-CM | POA: Diagnosis not present

## 2024-01-27 DIAGNOSIS — Z794 Long term (current) use of insulin: Secondary | ICD-10-CM | POA: Diagnosis not present

## 2024-01-27 DIAGNOSIS — L97512 Non-pressure chronic ulcer of other part of right foot with fat layer exposed: Secondary | ICD-10-CM | POA: Diagnosis not present

## 2024-01-27 DIAGNOSIS — Z7985 Long-term (current) use of injectable non-insulin antidiabetic drugs: Secondary | ICD-10-CM | POA: Diagnosis not present

## 2024-01-27 DIAGNOSIS — E11621 Type 2 diabetes mellitus with foot ulcer: Secondary | ICD-10-CM | POA: Diagnosis not present

## 2024-02-03 DIAGNOSIS — E11621 Type 2 diabetes mellitus with foot ulcer: Secondary | ICD-10-CM | POA: Diagnosis not present

## 2024-02-03 DIAGNOSIS — L97412 Non-pressure chronic ulcer of right heel and midfoot with fat layer exposed: Secondary | ICD-10-CM | POA: Diagnosis not present

## 2024-02-05 DIAGNOSIS — E113311 Type 2 diabetes mellitus with moderate nonproliferative diabetic retinopathy with macular edema, right eye: Secondary | ICD-10-CM | POA: Diagnosis not present

## 2024-02-10 DIAGNOSIS — L97812 Non-pressure chronic ulcer of other part of right lower leg with fat layer exposed: Secondary | ICD-10-CM | POA: Diagnosis not present

## 2024-02-10 DIAGNOSIS — Z794 Long term (current) use of insulin: Secondary | ICD-10-CM | POA: Diagnosis not present

## 2024-02-10 DIAGNOSIS — E11621 Type 2 diabetes mellitus with foot ulcer: Secondary | ICD-10-CM | POA: Diagnosis not present

## 2024-02-10 DIAGNOSIS — Z7985 Long-term (current) use of injectable non-insulin antidiabetic drugs: Secondary | ICD-10-CM | POA: Diagnosis not present

## 2024-02-10 DIAGNOSIS — E11622 Type 2 diabetes mellitus with other skin ulcer: Secondary | ICD-10-CM | POA: Diagnosis not present

## 2024-02-10 DIAGNOSIS — Z7984 Long term (current) use of oral hypoglycemic drugs: Secondary | ICD-10-CM | POA: Diagnosis not present

## 2024-02-10 DIAGNOSIS — L97412 Non-pressure chronic ulcer of right heel and midfoot with fat layer exposed: Secondary | ICD-10-CM | POA: Diagnosis not present

## 2024-02-15 DIAGNOSIS — E1165 Type 2 diabetes mellitus with hyperglycemia: Secondary | ICD-10-CM | POA: Diagnosis not present

## 2024-02-17 DIAGNOSIS — E11621 Type 2 diabetes mellitus with foot ulcer: Secondary | ICD-10-CM | POA: Diagnosis not present

## 2024-02-17 DIAGNOSIS — R609 Edema, unspecified: Secondary | ICD-10-CM | POA: Diagnosis not present

## 2024-02-17 DIAGNOSIS — L97412 Non-pressure chronic ulcer of right heel and midfoot with fat layer exposed: Secondary | ICD-10-CM | POA: Diagnosis not present

## 2024-03-05 DIAGNOSIS — Z794 Long term (current) use of insulin: Secondary | ICD-10-CM | POA: Diagnosis not present

## 2024-03-05 DIAGNOSIS — E11621 Type 2 diabetes mellitus with foot ulcer: Secondary | ICD-10-CM | POA: Diagnosis not present

## 2024-03-05 DIAGNOSIS — L97412 Non-pressure chronic ulcer of right heel and midfoot with fat layer exposed: Secondary | ICD-10-CM | POA: Diagnosis not present

## 2024-03-05 DIAGNOSIS — L97509 Non-pressure chronic ulcer of other part of unspecified foot with unspecified severity: Secondary | ICD-10-CM | POA: Diagnosis not present

## 2024-03-05 DIAGNOSIS — L97912 Non-pressure chronic ulcer of unspecified part of right lower leg with fat layer exposed: Secondary | ICD-10-CM | POA: Diagnosis not present

## 2024-03-11 DIAGNOSIS — Z7985 Long-term (current) use of injectable non-insulin antidiabetic drugs: Secondary | ICD-10-CM | POA: Diagnosis not present

## 2024-03-11 DIAGNOSIS — R600 Localized edema: Secondary | ICD-10-CM | POA: Diagnosis not present

## 2024-03-11 DIAGNOSIS — Z794 Long term (current) use of insulin: Secondary | ICD-10-CM | POA: Diagnosis not present

## 2024-03-11 DIAGNOSIS — Z7984 Long term (current) use of oral hypoglycemic drugs: Secondary | ICD-10-CM | POA: Diagnosis not present

## 2024-03-11 DIAGNOSIS — E11621 Type 2 diabetes mellitus with foot ulcer: Secondary | ICD-10-CM | POA: Diagnosis not present

## 2024-03-11 DIAGNOSIS — L97412 Non-pressure chronic ulcer of right heel and midfoot with fat layer exposed: Secondary | ICD-10-CM | POA: Diagnosis not present

## 2024-03-16 DIAGNOSIS — E1165 Type 2 diabetes mellitus with hyperglycemia: Secondary | ICD-10-CM | POA: Diagnosis not present

## 2024-03-17 DIAGNOSIS — E11621 Type 2 diabetes mellitus with foot ulcer: Secondary | ICD-10-CM | POA: Diagnosis not present

## 2024-03-18 DIAGNOSIS — L97412 Non-pressure chronic ulcer of right heel and midfoot with fat layer exposed: Secondary | ICD-10-CM | POA: Diagnosis not present

## 2024-03-18 DIAGNOSIS — L97912 Non-pressure chronic ulcer of unspecified part of right lower leg with fat layer exposed: Secondary | ICD-10-CM | POA: Diagnosis not present

## 2024-03-18 DIAGNOSIS — E11621 Type 2 diabetes mellitus with foot ulcer: Secondary | ICD-10-CM | POA: Diagnosis not present

## 2024-04-01 DIAGNOSIS — L97412 Non-pressure chronic ulcer of right heel and midfoot with fat layer exposed: Secondary | ICD-10-CM | POA: Diagnosis not present

## 2024-04-01 DIAGNOSIS — E11621 Type 2 diabetes mellitus with foot ulcer: Secondary | ICD-10-CM | POA: Diagnosis not present

## 2024-04-08 DIAGNOSIS — E11621 Type 2 diabetes mellitus with foot ulcer: Secondary | ICD-10-CM | POA: Diagnosis not present

## 2024-04-08 DIAGNOSIS — L97509 Non-pressure chronic ulcer of other part of unspecified foot with unspecified severity: Secondary | ICD-10-CM | POA: Diagnosis not present

## 2024-04-08 DIAGNOSIS — L97412 Non-pressure chronic ulcer of right heel and midfoot with fat layer exposed: Secondary | ICD-10-CM | POA: Diagnosis not present

## 2024-04-08 DIAGNOSIS — L97502 Non-pressure chronic ulcer of other part of unspecified foot with fat layer exposed: Secondary | ICD-10-CM | POA: Diagnosis not present

## 2024-04-08 DIAGNOSIS — Z794 Long term (current) use of insulin: Secondary | ICD-10-CM | POA: Diagnosis not present

## 2024-04-15 DIAGNOSIS — E11621 Type 2 diabetes mellitus with foot ulcer: Secondary | ICD-10-CM | POA: Diagnosis not present

## 2024-04-15 DIAGNOSIS — L97509 Non-pressure chronic ulcer of other part of unspecified foot with unspecified severity: Secondary | ICD-10-CM | POA: Diagnosis not present

## 2024-04-15 DIAGNOSIS — R609 Edema, unspecified: Secondary | ICD-10-CM | POA: Diagnosis not present

## 2024-04-15 DIAGNOSIS — L97412 Non-pressure chronic ulcer of right heel and midfoot with fat layer exposed: Secondary | ICD-10-CM | POA: Diagnosis not present

## 2024-04-15 DIAGNOSIS — Z794 Long term (current) use of insulin: Secondary | ICD-10-CM | POA: Diagnosis not present

## 2024-04-16 DIAGNOSIS — Z794 Long term (current) use of insulin: Secondary | ICD-10-CM | POA: Diagnosis not present

## 2024-04-16 DIAGNOSIS — I890 Lymphedema, not elsewhere classified: Secondary | ICD-10-CM | POA: Diagnosis not present

## 2024-04-16 DIAGNOSIS — E1165 Type 2 diabetes mellitus with hyperglycemia: Secondary | ICD-10-CM | POA: Diagnosis not present

## 2024-04-16 DIAGNOSIS — E11610 Type 2 diabetes mellitus with diabetic neuropathic arthropathy: Secondary | ICD-10-CM | POA: Diagnosis not present

## 2024-04-16 DIAGNOSIS — L97412 Non-pressure chronic ulcer of right heel and midfoot with fat layer exposed: Secondary | ICD-10-CM | POA: Diagnosis not present

## 2024-04-16 DIAGNOSIS — L97509 Non-pressure chronic ulcer of other part of unspecified foot with unspecified severity: Secondary | ICD-10-CM | POA: Diagnosis not present

## 2024-04-16 DIAGNOSIS — Z7985 Long-term (current) use of injectable non-insulin antidiabetic drugs: Secondary | ICD-10-CM | POA: Diagnosis not present

## 2024-04-16 DIAGNOSIS — E11621 Type 2 diabetes mellitus with foot ulcer: Secondary | ICD-10-CM | POA: Diagnosis not present

## 2024-04-19 DIAGNOSIS — E11621 Type 2 diabetes mellitus with foot ulcer: Secondary | ICD-10-CM | POA: Diagnosis not present

## 2024-04-21 ENCOUNTER — Telehealth: Payer: Self-pay | Admitting: Pharmacist

## 2024-04-21 NOTE — Progress Notes (Signed)
 Attempted to contact patient for medication management re: voice message left regarding insulin . Left HIPAA compliant message for patient to return my call at their convenience.    Annabella Galla, PharmD Clinical Pharmacist [place] Direct Dial: [phone]

## 2024-04-22 DIAGNOSIS — E113311 Type 2 diabetes mellitus with moderate nonproliferative diabetic retinopathy with macular edema, right eye: Secondary | ICD-10-CM | POA: Diagnosis not present

## 2024-04-23 DIAGNOSIS — L97412 Non-pressure chronic ulcer of right heel and midfoot with fat layer exposed: Secondary | ICD-10-CM | POA: Diagnosis not present

## 2024-04-23 DIAGNOSIS — Z794 Long term (current) use of insulin: Secondary | ICD-10-CM | POA: Diagnosis not present

## 2024-04-23 DIAGNOSIS — L97509 Non-pressure chronic ulcer of other part of unspecified foot with unspecified severity: Secondary | ICD-10-CM | POA: Diagnosis not present

## 2024-04-23 DIAGNOSIS — E11621 Type 2 diabetes mellitus with foot ulcer: Secondary | ICD-10-CM | POA: Diagnosis not present

## 2024-04-30 DIAGNOSIS — E11621 Type 2 diabetes mellitus with foot ulcer: Secondary | ICD-10-CM | POA: Diagnosis not present

## 2024-05-04 DIAGNOSIS — N1832 Chronic kidney disease, stage 3b: Secondary | ICD-10-CM | POA: Diagnosis not present

## 2024-05-07 DIAGNOSIS — L97412 Non-pressure chronic ulcer of right heel and midfoot with fat layer exposed: Secondary | ICD-10-CM | POA: Diagnosis not present

## 2024-05-07 DIAGNOSIS — E11621 Type 2 diabetes mellitus with foot ulcer: Secondary | ICD-10-CM | POA: Diagnosis not present

## 2024-05-11 DIAGNOSIS — E11621 Type 2 diabetes mellitus with foot ulcer: Secondary | ICD-10-CM | POA: Diagnosis not present

## 2024-05-11 DIAGNOSIS — L97412 Non-pressure chronic ulcer of right heel and midfoot with fat layer exposed: Secondary | ICD-10-CM | POA: Diagnosis not present

## 2024-05-11 DIAGNOSIS — Z794 Long term (current) use of insulin: Secondary | ICD-10-CM | POA: Diagnosis not present

## 2024-05-11 DIAGNOSIS — R600 Localized edema: Secondary | ICD-10-CM | POA: Diagnosis not present

## 2024-05-11 DIAGNOSIS — Z7984 Long term (current) use of oral hypoglycemic drugs: Secondary | ICD-10-CM | POA: Diagnosis not present

## 2024-05-14 DIAGNOSIS — L97412 Non-pressure chronic ulcer of right heel and midfoot with fat layer exposed: Secondary | ICD-10-CM | POA: Diagnosis not present

## 2024-05-14 DIAGNOSIS — N1832 Chronic kidney disease, stage 3b: Secondary | ICD-10-CM | POA: Diagnosis not present

## 2024-05-14 DIAGNOSIS — R600 Localized edema: Secondary | ICD-10-CM | POA: Diagnosis not present

## 2024-05-14 DIAGNOSIS — I129 Hypertensive chronic kidney disease with stage 1 through stage 4 chronic kidney disease, or unspecified chronic kidney disease: Secondary | ICD-10-CM | POA: Diagnosis not present

## 2024-05-14 DIAGNOSIS — E1122 Type 2 diabetes mellitus with diabetic chronic kidney disease: Secondary | ICD-10-CM | POA: Diagnosis not present

## 2024-05-14 DIAGNOSIS — E785 Hyperlipidemia, unspecified: Secondary | ICD-10-CM | POA: Diagnosis not present

## 2024-05-14 DIAGNOSIS — E11621 Type 2 diabetes mellitus with foot ulcer: Secondary | ICD-10-CM | POA: Diagnosis not present

## 2024-05-17 DIAGNOSIS — E1165 Type 2 diabetes mellitus with hyperglycemia: Secondary | ICD-10-CM | POA: Diagnosis not present

## 2024-05-24 DIAGNOSIS — L97412 Non-pressure chronic ulcer of right heel and midfoot with fat layer exposed: Secondary | ICD-10-CM | POA: Diagnosis not present

## 2024-05-24 DIAGNOSIS — E11621 Type 2 diabetes mellitus with foot ulcer: Secondary | ICD-10-CM | POA: Diagnosis not present

## 2024-05-25 DIAGNOSIS — L97509 Non-pressure chronic ulcer of other part of unspecified foot with unspecified severity: Secondary | ICD-10-CM | POA: Diagnosis not present

## 2024-05-25 DIAGNOSIS — E1129 Type 2 diabetes mellitus with other diabetic kidney complication: Secondary | ICD-10-CM | POA: Diagnosis not present

## 2024-05-25 DIAGNOSIS — Z6832 Body mass index (BMI) 32.0-32.9, adult: Secondary | ICD-10-CM | POA: Diagnosis not present

## 2024-05-25 DIAGNOSIS — E103311 Type 1 diabetes mellitus with moderate nonproliferative diabetic retinopathy with macular edema, right eye: Secondary | ICD-10-CM | POA: Diagnosis not present

## 2024-05-25 DIAGNOSIS — E119 Type 2 diabetes mellitus without complications: Secondary | ICD-10-CM | POA: Diagnosis not present

## 2024-05-25 DIAGNOSIS — N1832 Chronic kidney disease, stage 3b: Secondary | ICD-10-CM | POA: Diagnosis not present

## 2024-05-25 DIAGNOSIS — J069 Acute upper respiratory infection, unspecified: Secondary | ICD-10-CM | POA: Diagnosis not present

## 2024-05-25 DIAGNOSIS — K5909 Other constipation: Secondary | ICD-10-CM | POA: Diagnosis not present

## 2024-05-25 DIAGNOSIS — Z794 Long term (current) use of insulin: Secondary | ICD-10-CM | POA: Diagnosis not present

## 2024-05-25 DIAGNOSIS — E11621 Type 2 diabetes mellitus with foot ulcer: Secondary | ICD-10-CM | POA: Diagnosis not present

## 2024-05-25 DIAGNOSIS — E113399 Type 2 diabetes mellitus with moderate nonproliferative diabetic retinopathy without macular edema, unspecified eye: Secondary | ICD-10-CM | POA: Diagnosis not present

## 2024-06-07 DIAGNOSIS — I890 Lymphedema, not elsewhere classified: Secondary | ICD-10-CM | POA: Diagnosis not present

## 2024-06-07 DIAGNOSIS — E11621 Type 2 diabetes mellitus with foot ulcer: Secondary | ICD-10-CM | POA: Diagnosis not present

## 2024-06-07 DIAGNOSIS — L97412 Non-pressure chronic ulcer of right heel and midfoot with fat layer exposed: Secondary | ICD-10-CM | POA: Diagnosis not present

## 2024-06-14 DIAGNOSIS — E1140 Type 2 diabetes mellitus with diabetic neuropathy, unspecified: Secondary | ICD-10-CM | POA: Diagnosis not present

## 2024-06-14 DIAGNOSIS — R600 Localized edema: Secondary | ICD-10-CM | POA: Diagnosis not present

## 2024-06-14 DIAGNOSIS — Z794 Long term (current) use of insulin: Secondary | ICD-10-CM | POA: Diagnosis not present

## 2024-06-14 DIAGNOSIS — L97412 Non-pressure chronic ulcer of right heel and midfoot with fat layer exposed: Secondary | ICD-10-CM | POA: Diagnosis not present

## 2024-06-14 DIAGNOSIS — I890 Lymphedema, not elsewhere classified: Secondary | ICD-10-CM | POA: Diagnosis not present

## 2024-06-14 DIAGNOSIS — E11610 Type 2 diabetes mellitus with diabetic neuropathic arthropathy: Secondary | ICD-10-CM | POA: Diagnosis not present

## 2024-06-14 DIAGNOSIS — E11621 Type 2 diabetes mellitus with foot ulcer: Secondary | ICD-10-CM | POA: Diagnosis not present

## 2024-06-14 DIAGNOSIS — Z5971 Insufficient health insurance coverage: Secondary | ICD-10-CM | POA: Diagnosis not present

## 2024-06-16 DIAGNOSIS — E1165 Type 2 diabetes mellitus with hyperglycemia: Secondary | ICD-10-CM | POA: Diagnosis not present

## 2024-06-21 DIAGNOSIS — E11621 Type 2 diabetes mellitus with foot ulcer: Secondary | ICD-10-CM | POA: Diagnosis not present

## 2024-06-21 DIAGNOSIS — R600 Localized edema: Secondary | ICD-10-CM | POA: Diagnosis not present

## 2024-06-21 DIAGNOSIS — Z794 Long term (current) use of insulin: Secondary | ICD-10-CM | POA: Diagnosis not present

## 2024-06-21 DIAGNOSIS — L97412 Non-pressure chronic ulcer of right heel and midfoot with fat layer exposed: Secondary | ICD-10-CM | POA: Diagnosis not present

## 2024-06-28 DIAGNOSIS — Z794 Long term (current) use of insulin: Secondary | ICD-10-CM | POA: Diagnosis not present

## 2024-06-28 DIAGNOSIS — R600 Localized edema: Secondary | ICD-10-CM | POA: Diagnosis not present

## 2024-06-28 DIAGNOSIS — E11621 Type 2 diabetes mellitus with foot ulcer: Secondary | ICD-10-CM | POA: Diagnosis not present

## 2024-06-28 DIAGNOSIS — L97412 Non-pressure chronic ulcer of right heel and midfoot with fat layer exposed: Secondary | ICD-10-CM | POA: Diagnosis not present

## 2024-07-05 DIAGNOSIS — R600 Localized edema: Secondary | ICD-10-CM | POA: Diagnosis not present

## 2024-07-05 DIAGNOSIS — E11621 Type 2 diabetes mellitus with foot ulcer: Secondary | ICD-10-CM | POA: Diagnosis not present

## 2024-07-05 DIAGNOSIS — L97412 Non-pressure chronic ulcer of right heel and midfoot with fat layer exposed: Secondary | ICD-10-CM | POA: Diagnosis not present

## 2024-07-05 DIAGNOSIS — Z794 Long term (current) use of insulin: Secondary | ICD-10-CM | POA: Diagnosis not present

## 2024-07-09 DIAGNOSIS — L239 Allergic contact dermatitis, unspecified cause: Secondary | ICD-10-CM | POA: Diagnosis not present

## 2024-07-09 DIAGNOSIS — Z6833 Body mass index (BMI) 33.0-33.9, adult: Secondary | ICD-10-CM | POA: Diagnosis not present

## 2024-07-12 DIAGNOSIS — L97412 Non-pressure chronic ulcer of right heel and midfoot with fat layer exposed: Secondary | ICD-10-CM | POA: Diagnosis not present

## 2024-07-12 DIAGNOSIS — R600 Localized edema: Secondary | ICD-10-CM | POA: Diagnosis not present

## 2024-07-12 DIAGNOSIS — Z945 Skin transplant status: Secondary | ICD-10-CM | POA: Diagnosis not present

## 2024-07-12 DIAGNOSIS — L97312 Non-pressure chronic ulcer of right ankle with fat layer exposed: Secondary | ICD-10-CM | POA: Diagnosis not present

## 2024-07-12 DIAGNOSIS — E11610 Type 2 diabetes mellitus with diabetic neuropathic arthropathy: Secondary | ICD-10-CM | POA: Diagnosis not present

## 2024-07-12 DIAGNOSIS — E11621 Type 2 diabetes mellitus with foot ulcer: Secondary | ICD-10-CM | POA: Diagnosis not present

## 2024-07-12 DIAGNOSIS — Z7984 Long term (current) use of oral hypoglycemic drugs: Secondary | ICD-10-CM | POA: Diagnosis not present

## 2024-07-12 DIAGNOSIS — Z794 Long term (current) use of insulin: Secondary | ICD-10-CM | POA: Diagnosis not present

## 2024-07-12 DIAGNOSIS — Z7985 Long-term (current) use of injectable non-insulin antidiabetic drugs: Secondary | ICD-10-CM | POA: Diagnosis not present

## 2024-07-12 DIAGNOSIS — E11622 Type 2 diabetes mellitus with other skin ulcer: Secondary | ICD-10-CM | POA: Diagnosis not present

## 2024-07-15 DIAGNOSIS — M14671 Charcot's joint, right ankle and foot: Secondary | ICD-10-CM | POA: Diagnosis not present

## 2024-07-15 DIAGNOSIS — L97512 Non-pressure chronic ulcer of other part of right foot with fat layer exposed: Secondary | ICD-10-CM | POA: Diagnosis not present

## 2024-07-15 DIAGNOSIS — E1140 Type 2 diabetes mellitus with diabetic neuropathy, unspecified: Secondary | ICD-10-CM | POA: Diagnosis not present

## 2024-07-15 DIAGNOSIS — Z794 Long term (current) use of insulin: Secondary | ICD-10-CM | POA: Diagnosis not present

## 2024-07-17 DIAGNOSIS — E1165 Type 2 diabetes mellitus with hyperglycemia: Secondary | ICD-10-CM | POA: Diagnosis not present

## 2024-07-19 DIAGNOSIS — L97412 Non-pressure chronic ulcer of right heel and midfoot with fat layer exposed: Secondary | ICD-10-CM | POA: Diagnosis not present

## 2024-07-19 DIAGNOSIS — E11621 Type 2 diabetes mellitus with foot ulcer: Secondary | ICD-10-CM | POA: Diagnosis not present

## 2024-07-22 DIAGNOSIS — E113313 Type 2 diabetes mellitus with moderate nonproliferative diabetic retinopathy with macular edema, bilateral: Secondary | ICD-10-CM | POA: Diagnosis not present

## 2024-07-26 DIAGNOSIS — E11621 Type 2 diabetes mellitus with foot ulcer: Secondary | ICD-10-CM | POA: Diagnosis not present

## 2024-07-26 DIAGNOSIS — L97412 Non-pressure chronic ulcer of right heel and midfoot with fat layer exposed: Secondary | ICD-10-CM | POA: Diagnosis not present

## 2024-08-09 DIAGNOSIS — G600 Hereditary motor and sensory neuropathy: Secondary | ICD-10-CM | POA: Diagnosis not present

## 2024-08-09 DIAGNOSIS — L97509 Non-pressure chronic ulcer of other part of unspecified foot with unspecified severity: Secondary | ICD-10-CM | POA: Diagnosis not present

## 2024-08-09 DIAGNOSIS — R600 Localized edema: Secondary | ICD-10-CM | POA: Diagnosis not present

## 2024-08-09 DIAGNOSIS — L97412 Non-pressure chronic ulcer of right heel and midfoot with fat layer exposed: Secondary | ICD-10-CM | POA: Diagnosis not present

## 2024-08-09 DIAGNOSIS — Z794 Long term (current) use of insulin: Secondary | ICD-10-CM | POA: Diagnosis not present
